# Patient Record
Sex: Male | Born: 1970 | Race: White | Hispanic: Yes | Marital: Married | Smoking: Never smoker
Health system: Southern US, Community
[De-identification: ages and names within clinical notes are randomized; demographics above are authoritative.]

## PROBLEM LIST (undated history)

## (undated) DIAGNOSIS — E781 Pure hyperglyceridemia: Secondary | ICD-10-CM

## (undated) HISTORY — PX: OTHER SURGICAL HISTORY: SHX169

## (undated) HISTORY — DX: Pure hyperglyceridemia: E78.1

---

## 2009-09-26 ENCOUNTER — Emergency Department (HOSPITAL_COMMUNITY): Admission: EM | Admit: 2009-09-26 | Discharge: 2009-09-26 | Payer: Self-pay | Admitting: Emergency Medicine

## 2009-11-30 ENCOUNTER — Encounter (INDEPENDENT_AMBULATORY_CARE_PROVIDER_SITE_OTHER): Payer: Self-pay | Admitting: Family Medicine

## 2009-11-30 ENCOUNTER — Ambulatory Visit: Payer: Self-pay | Admitting: Internal Medicine

## 2009-11-30 LAB — CONVERTED CEMR LAB
ALT: 33 units/L (ref 0–53)
AST: 20 units/L (ref 0–37)
Albumin: 4.4 g/dL (ref 3.5–5.2)
Alkaline Phosphatase: 70 units/L (ref 39–117)
Basophils Relative: 1 % (ref 0–1)
Calcium: 9.1 mg/dL (ref 8.4–10.5)
Chloride: 105 meq/L (ref 96–112)
Eosinophils Absolute: 0.2 10*3/uL (ref 0.0–0.7)
MCHC: 34.2 g/dL (ref 30.0–36.0)
MCV: 93.9 fL (ref 78.0–100.0)
Neutro Abs: 2.4 10*3/uL (ref 1.7–7.7)
Neutrophils Relative %: 54 % (ref 43–77)
Platelets: 153 10*3/uL (ref 150–400)
Potassium: 3.9 meq/L (ref 3.5–5.3)
RBC: 4.77 M/uL (ref 4.22–5.81)
Sodium: 141 meq/L (ref 135–145)
TSH: 1.092 microintl units/mL (ref 0.350–4.500)
Total Protein: 6.6 g/dL (ref 6.0–8.3)
WBC: 4.5 10*3/uL (ref 4.0–10.5)

## 2009-12-14 ENCOUNTER — Encounter (INDEPENDENT_AMBULATORY_CARE_PROVIDER_SITE_OTHER): Payer: Self-pay | Admitting: Family Medicine

## 2009-12-14 LAB — CONVERTED CEMR LAB
Cholesterol: 169 mg/dL (ref 0–200)
Triglycerides: 105 mg/dL (ref <150)
VLDL: 21 mg/dL (ref 0–40)

## 2015-08-11 DIAGNOSIS — L743 Miliaria, unspecified: Secondary | ICD-10-CM | POA: Insufficient documentation

## 2015-08-11 DIAGNOSIS — L309 Dermatitis, unspecified: Secondary | ICD-10-CM | POA: Insufficient documentation

## 2016-09-14 DIAGNOSIS — L299 Pruritus, unspecified: Secondary | ICD-10-CM | POA: Insufficient documentation

## 2016-09-14 DIAGNOSIS — L209 Atopic dermatitis, unspecified: Secondary | ICD-10-CM | POA: Insufficient documentation

## 2018-10-23 ENCOUNTER — Ambulatory Visit: Payer: Self-pay | Attending: Nurse Practitioner | Admitting: Nurse Practitioner

## 2018-10-23 ENCOUNTER — Encounter: Payer: Self-pay | Admitting: Nurse Practitioner

## 2018-10-23 ENCOUNTER — Other Ambulatory Visit: Payer: Self-pay

## 2018-10-23 VITALS — Ht 63.0 in | Wt 165.0 lb

## 2018-10-23 DIAGNOSIS — M25561 Pain in right knee: Secondary | ICD-10-CM

## 2018-10-23 DIAGNOSIS — R200 Anesthesia of skin: Secondary | ICD-10-CM

## 2018-10-23 DIAGNOSIS — E781 Pure hyperglyceridemia: Secondary | ICD-10-CM

## 2018-10-23 DIAGNOSIS — Z7689 Persons encountering health services in other specified circumstances: Secondary | ICD-10-CM

## 2018-10-23 MED ORDER — IBUPROFEN 800 MG PO TABS: 800.0000 mg | ORAL_TABLET | Freq: Three times a day (TID) | ORAL | 1 refills | Status: AC | PRN

## 2018-10-23 NOTE — Progress Notes (Signed)
Virtual Visit via Telephone Note Due to national recommendations of social distancing due to COVID 19, telehealth visit is felt to be most appropriate for this patient at this time.  I discussed the limitations, risks, security and privacy concerns of performing an evaluation and management service by telephone and the availability of in person appointments. I also discussed with the patient that there may be a patient responsible charge related to this service. The patient expressed understanding and agreed to proceed.    I connected with Danny Schneider on 10/23/18  at  10:50 AM EDT  EDT by telephone and verified that I am speaking with the correct person using two identifiers.   Consent I discussed the limitations, risks, security and privacy concerns of performing an evaluation and management service by telephone and the availability of in person appointments. I also discussed with the patient that there may be a patient responsible charge related to this service. The patient expressed understanding and agreed to proceed.   Location of Patient: Private  Residence   [facility]    Persons participating in Telemedicine visit: Danny DenverZelda  FNP-BC [facility] CMA Danny Schneider  Spanish Interpreter Danny NestleRamon [place] [phone]   History of Present Illness: Telemedicine visit for: Establish care  Denies any PMH of HTN, DM, Thyroid disorder. He has a history of hypertriglyceridemia.   Right Knee Pain He has a history of right knee surgery 20 years ago after he fell one night and hit his knee against a hard object. States his knee was swollen and blood had to be removed from around the knee. Currently endorses right knee pain with onset 15 days ago. Denies any injury or trauma. States he is not sure if he caused the pain by walking. He has been applying topical ointments to his knee to help relieve his pain. He denies any swelling of the area or  erythema. He has not taken any medication for his knee pain.   He also endorses bilateral hand numbness. 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 He has worked at [employer] for 10 years and has been experiencing bilateral hand numbness for about 6 years. He does not wear bilateral wrist splints.     Past Medical History:  Diagnosis Date  . Hypertriglyceridemia     Past Surgical History:  Procedure Laterality Date  . Right knee surgery      Family History  Problem Relation Age of Onset  . Diabetes Paternal Uncle     Social History   Socioeconomic History  . Marital status: Married    Spouse name: Not on file  . Number of children: Not on file  . Years of education: Not on file  . Highest education level: Not on file  Occupational History  . Not on file  Social Needs  . Financial resource strain: Not on file  . Food insecurity    Worry: Not on file    Inability: Not on file  . Transportation needs    Medical: Not on file    Non-medical: Not on file  Tobacco Use  . Smoking status: Never Smoker  . Smokeless tobacco: Never Used  Substance and Sexual Activity  . Alcohol use: Yes  . Drug use: Not Currently    Types: Marijuana  . Sexual activity: Yes  Lifestyle  . Physical activity    Days per week: Not on file    Minutes per session: Not on file  .  Stress: Not on file  Relationships  . Social Herbalist on phone: Not on file    Gets together: Not on file    Attends religious service: Not on file    Active member of club or organization: Not on file    Attends meetings of clubs or organizations: Not on file    Relationship status: Not on file  Other Topics Concern  . Not on file  Social History Narrative  . Not on file     Observations/Objective: Awake, alert and oriented x 3   Review of Systems  Constitutional: Negative for fever, malaise/fatigue and weight loss.  HENT: Negative.  Negative for nosebleeds.   Eyes: Negative.  Negative  for blurred vision, double vision and photophobia.  Respiratory: Negative.  Negative for cough and shortness of breath.   Cardiovascular: Negative.  Negative for chest pain, palpitations and leg swelling.  Gastrointestinal: Negative.  Negative for heartburn, nausea and vomiting.  Musculoskeletal: Positive for joint pain. Negative for myalgias.  Neurological: Positive for tingling and sensory change. Negative for dizziness, focal weakness, seizures and headaches.  Psychiatric/Behavioral: Negative.  Negative for suicidal ideas.    Assessment and Plan:  Diagnoses and all orders for this visit:  Encounter to establish care  Acute pain of right knee -     ibuprofen (ADVIL) 800 MG tablet; Take 1 tablet (800 mg total) by mouth every 8 (eight) hours as needed. May need imaging for effusion Patient has been advised to apply for financial assistance and schedule to see our financial counselor.   Hypertriglyceridemia Fasting lipid panel at next office visit  Bilateral hand numbness -     ibuprofen (ADVIL) 800 MG tablet; Take 1 tablet (800 mg total) by mouth every 8 (eight) hours as needed. Likely carpal tunnel based on work history Instructed to purchase bilateral wrist splints.     Follow Up Instructions Return in about 6 weeks (around 12/04/2018) for knee pain, bilateral hand numbness.     I discussed the assessment and treatment plan with the patient. The patient was provided an opportunity to ask questions and all were answered. The patient agreed with the plan and demonstrated an understanding of the instructions.   The patient was advised to call back or seek an in-person evaluation if the symptoms worsen or if the condition fails to improve as anticipated.  I provided 19 minutes of non-face-to-face time during this encounter including median intraservice time, reviewing previous notes, labs, imaging, medications and explaining diagnosis and management.  [name], FNP-BC

## 2018-11-20 ENCOUNTER — Ambulatory Visit: Payer: Self-pay | Attending: Nurse Practitioner | Admitting: Nurse Practitioner

## 2018-11-20 ENCOUNTER — Other Ambulatory Visit: Payer: Self-pay

## 2018-11-20 ENCOUNTER — Ambulatory Visit (HOSPITAL_BASED_OUTPATIENT_CLINIC_OR_DEPARTMENT_OTHER): Payer: Self-pay | Admitting: Pharmacist

## 2018-11-20 ENCOUNTER — Encounter: Payer: Self-pay | Admitting: Nurse Practitioner

## 2018-11-20 VITALS — BP 135/86 | HR 53 | Temp 97.8°F | Ht 64.0 in | Wt 153.0 lb

## 2018-11-20 DIAGNOSIS — G5603 Carpal tunnel syndrome, bilateral upper limbs: Secondary | ICD-10-CM

## 2018-11-20 DIAGNOSIS — Z131 Encounter for screening for diabetes mellitus: Secondary | ICD-10-CM

## 2018-11-20 DIAGNOSIS — G8929 Other chronic pain: Secondary | ICD-10-CM

## 2018-11-20 DIAGNOSIS — Z23 Encounter for immunization: Secondary | ICD-10-CM

## 2018-11-20 DIAGNOSIS — E781 Pure hyperglyceridemia: Secondary | ICD-10-CM

## 2018-11-20 DIAGNOSIS — M25561 Pain in right knee: Secondary | ICD-10-CM

## 2018-11-20 NOTE — Progress Notes (Signed)
Assessment & Plan:  Danny Schneider was seen today for follow-up.  Diagnoses and all orders for this visit:  Chronic pain of right knee -     CBC -     DG Knee 1-2 Views Right; Future Continue ibuprofen as prescribed  Hypertriglyceridemia -     CMP14+EGFR -     Lipid panel  Encounter for screening for diabetes mellitus -     Hemoglobin A1c  Carpal tunnel syndrome, bilateral -     DG Wrist Complete Right; Future -     DG Wrist Complete Left; Future Continue ibuprofen as prescribed Recommend bilateral wrist splints for at least 4-6 weeks    Patient has been counseled on age-appropriate routine health concerns for screening and prevention. These are reviewed and up-to-date. Referrals have been placed accordingly. Immunizations are up-to-date or declined.    Subjective:   Chief Complaint  Patient presents with  . Follow-up    Pt. stated he have pain on his right knee and numbness on his hands.    HPI Danny Schneider 48 y.o. male presents to office today for follow up of right knee pain and and bilateral hand numbness.    Right Knee Pain He has a history of right knee surgery 20 years ago after he fell one night and hit his knee against a hard object. States his knee was swollen and blood had to be removed from around the knee. Currently endorses re occurrence of right knee pain with onset over 1 month ago. Denies any recent injury or trauma however he is an active runner/jogger.  He has been taking po ibuprofen since his last appt with me a few weeks ago. He notes it does provide some relief of his pain. He denies any swelling of the area or erythema. Aggravating factors: Walking and jogging.   Carpal Tunnel Syndrome: Patient presents for presents evaluation of pain in hands, hand paresthesias and possible carpal tunnel syndrome.  Onset of the symptoms was several years ago. Current symptoms include tingling involving the bilateral wrists and pain involving the bilateral forearms.   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 Patient's course of LS:LHTDSKAJ have progressed to a point and plateaued. Evaluation to date: none.  Treatment to date: none.He has worked at [employer] for 10 years and has been experiencing bilateral hand numbness for about 6 years. He does not wear bilateral wrist splints. Pain is worse with driving.   Review of Systems  Constitutional: Negative for fever, malaise/fatigue and weight loss.  HENT: Negative.  Negative for nosebleeds.   Eyes: Negative.  Negative for blurred vision, double vision and photophobia.  Respiratory: Negative.  Negative for cough and shortness of breath.   Cardiovascular: Negative.  Negative for chest pain, palpitations and leg swelling.  Gastrointestinal: Negative.  Negative for heartburn, nausea and vomiting.  Musculoskeletal: Positive for joint pain. Negative for myalgias.       SEE HPI  Neurological: Negative.  Negative for dizziness, focal weakness, seizures and headaches.  Psychiatric/Behavioral: Negative.  Negative for suicidal ideas.    Past Medical History:  Diagnosis Date  . Hypertriglyceridemia     Past Surgical History:  Procedure Laterality Date  . Right knee surgery      Family History  Problem Relation Age of Onset  . Diabetes Paternal Uncle     Social History Reviewed with no changes to be made today.   Outpatient Medications Prior to  Visit  Medication Sig Dispense Refill  . ibuprofen (ADVIL) 800 MG tablet Take 1 tablet (800 mg total) by mouth every 8 (eight) hours as needed. 60 tablet 1   No facility-administered medications prior to visit.     No Known Allergies     Objective:    BP 135/86 (BP Location: Left Arm, Patient Position: Sitting, Cuff Size: Normal)   Pulse (!) 53   Temp 97.8 F (36.6 C) (Oral)   Ht _0  (1.626 m)   Wt 153 lb (69.4 kg)   SpO2 98%   BMI 26.26 kg/m  Wt Readings  from Last 3 Encounters:  11/20/18 153 lb (69.4 kg)  10/23/18 165 lb (74.8 kg)    Physical Exam Vitals signs and nursing note reviewed.  Constitutional:      Appearance: He is well-developed.  HENT:     Head: Normocephalic and atraumatic.  Neck:     Musculoskeletal: Normal range of motion.  Cardiovascular:     Rate and Rhythm: Normal rate and regular rhythm.     Heart sounds: Normal heart sounds. No murmur. No friction rub. No gallop.   Pulmonary:     Effort: Pulmonary effort is normal. No tachypnea or respiratory distress.     Breath sounds: Normal breath sounds. No decreased breath sounds, wheezing, rhonchi or rales.  Chest:     Chest wall: No tenderness.  Abdominal:     General: Bowel sounds are normal.     Palpations: Abdomen is soft.  Musculoskeletal: Normal range of motion.     Right knee: He exhibits normal range of motion, no swelling and no effusion. Tenderness found. Medial joint line tenderness noted.     Right hand: He exhibits normal range of motion. Normal strength noted.     Left hand: He exhibits normal range of motion. Normal strength noted.       Legs:  Skin:    General: Skin is warm and dry.  Neurological:     Mental Status: He is alert and oriented to person, place, and time.     Coordination: Coordination normal.  Psychiatric:        Behavior: Behavior normal. Behavior is cooperative.        Thought Content: Thought content normal.        Judgment: Judgment normal.        Patient has been counseled extensively about nutrition and exercise as well as the importance of adherence with medications and regular follow-up. The patient was given clear instructions to go to ER or return to medical center if symptoms don't improve, worsen or new problems develop. The patient verbalized understanding.   Follow-up: Return in about 6 weeks (around 01/01/2019).   [name], FNP-BC [facility] [place], [place]   11/20/2018, 1:32 PM

## 2018-11-20 NOTE — Progress Notes (Signed)
Patient presents for vaccination against influenza and tetanus per orders of [name]. Consent given. Counseling provided. No contraindications exists. Vaccine administered without incident.

## 2018-11-20 NOTE — Patient Instructions (Addendum)
Sndrome del tnel carpiano Carpal Tunnel Syndrome  El sndrome del tnel carpiano es una afeccin que causa dolor en la mano y en el brazo. El tnel carpiano es un rea estrecha que se encuentra en el lado palmar de la [place]. Los movimientos repetidos de la mueca o determinadas enfermedades pueden causar hinchazn en el tnel. Esta hinchazn puede comprimir el nervio principal de la mueca (nervio mediano). Cules son las causas? Esta afeccin puede ser causada por lo siguiente:  Movimientos repetidos de [employer].  Lesiones en la [place].  Artritis.  Un saco lleno de lquido (quiste) o un crecimiento anormal (tumor) en el tnel carpiano.  Acumulacin de lquido Solicitor. Algunas veces, la causa no se conoce. Qu incrementa el riesgo? Los siguientes factores pueden hacer que sea ms propenso a Emergency planning/management officer afeccin:  Tener un trabajo que exige mover la [place] del mismo modo muchas veces. Esto incluye trabajos como el de carnicero o el de cajero.  Ser mujer.  Tener otras afecciones, por ejemplo: ? Diabetes. ? Obesidad. ? Una glndula tiroidea que no es lo suficientemente activa (hipotiroidismo). ? Insuficiencia renal. Cules son los signos o sntomas? Los sntomas de esta afeccin incluyen:  Sensacin de hormigueo en los dedos.  Hormigueo o prdida de la sensibilidad (adormecimiento) en la mano.  Dolor en todo el brazo. Este dolor puede empeorar al flexionar la [place] y el codo durante [place].  Dolor en la mueca que sube por el brazo hasta el hombro.  Dolor que baja hasta la palma de la mano o los dedos.  Sensacin de [employer]itor. Puede resultarle difcil tomar y Licensed conveyancer. Es posible que se sienta peor por la noche. Cmo se diagnostica? Esta afeccin se diagnostica mediante los antecedentes mdicos y un examen fsico. Tambin pueden hacerle estudios, por ejemplo:  Una electromiograma (EMG). Este estudio comprueba  las seales que los nervios envan a los msculos.  Estudio de [employer]. Este estudio comprueba si las seales pasan correctamente por los nervios.  Estudios de diagnstico por imgenes, como radiografas, una ecografa y Public house manager (RM). Estos estudios Chiropodist cul podra ser la causa de su afeccin. Cmo se trata? El tratamiento de esta afeccin puede incluir:  Cambios en el estilo de vida. Se le pedir que deje o cambie la actividad que caus el problema.  Hacer ejercicio y actividades para que los msculos y los huesos se vuelvan ms fuertes (fisioterapia).  Aprender a Control and instrumentation engineer nuevamente (terapia ocupacional).  Medicamentos para tratar Conservation officer, historic buildings y la hinchazn (inflamacin). Es posible que le apliquen inyecciones en la [place].  Una frula para la [place].  Clementeen Hoof. Siga estas indicaciones en su casa: Si tiene una frula:  Use la frula como se lo haya indicado el mdico. Quteselo solamente como se lo haya indicado el mdico.  Afloje la frula si los dedos: ? Hormiguean. ? Pierden la sensibilidad (se adormecen). ? Se tornan fros y de [employer].  Mantenga la frula limpia.  Si la frula no es impermeable: ? No deje que se moje. ? Cbralo con un envoltorio hermtico cuando tome un bao de inmersin o [place]. Control del dolor, la rigidez y la hinchazn   Si se lo indican, aplique hielo sobre la zona dolorida: ? Si tiene una frula desmontable, qutesela como se lo haya indicado el mdico. ? Ponga el hielo en una bolsa plstica. ? Coloque una [employer] piel y la  bolsa. ? Coloque el hielo durante 5mnutos, 2a3veces al da. Indicaciones generales  TDelphide venta libre y los recetados solamente como se lo haya indicado el mdico.  Descanse la mBrock Hallde cualquier actividad que le cause dolor. Si es necesario, hable con su jefe en el tAffiliated Computer Servicescambios que pueden ayudar a la curacin de la  m[place]  Haga los ejercicios como se lo hayan indicado el mdico, el fisioterapeuta o el terapeuta ocupacional.  Concurra a todas las visitas de seguimiento como se lo haya indicado el mdico. Esto es importante. Comunquese con un mdico si:  Aparecen nuevos sntomas.  Los medicamentos no lForensic psychologist  Sus sntomas empeoran. Solicite ayuda de inmediato si:  Tiene adormecimiento u hormigueo muy intensos en la mueca o la mBreinigsville Resumen  El sndrome del tnel carpiano es una afeccin que causa dolor en la mano y en el brazo.  Suele deberse a movimientos repetidos de lAdvertising copywriter  Este problema se trata mediante cambios en el estilo de vida y medicamentos. La ciruga puede ser necesaria en casos muy graves.  Siga las indicaciones del mdico sobre el uso de una frula, el reposo de la mMoran la asistencia a las consultas de control y lSolicitorpara pedir ayuda. Esta informacin no tiene cMarine scientistel consejo del mdico. Asegrese de hacerle al mdico cualquier pregunta que tenga. Document Released: 02/24/2011 Document Revised: 08/10/2017 Document Reviewed: 08/10/2017 Elsevier Patient Education  2Phillipssndrome del tnel carpiano Preventing Carpal Tunnel Syndrome  El sndrome del tnel carpiano es una afeccin que causa dolor, adormecimiento y debilidad en la mUncertain la mano y los dedos. El tnel carpiano es una cavidad angosta que se encuentra en la mUrb[place] Por el tnel carpiano pasan varios tendones y uPulte Homesnervios de la mano (nervio mDyess. El nervio mediano da sensibilidad al pulgar y a los primeros tres dedos. Tambin a los msculos que estn en la base del pulgar. El sndrome del tnel carpiano sucede cuando el nervio mediano es apretado en el rea donde pasa por el tnel carpiano. En algunos casos, quizs no sea posible prevenir el sndrome del tnel carpiano. Sin embargo, usted puede tomar medidas para aHuman resources officerpresin  sobre la mBurkey reducir el riesgo de dEngineer, manufacturing systems Cmo puede afectarme esta enfermedad? El sndrome del tnel carpiano puede afectar su capacidad para hacer trabajos o actividades que involucren la accin de la mano, la mKaltagy los dedos. Puede provocar sntomas como los siguientes:  Dolor en la mPinedale la mano y los dedos.  Ardor, hormigueo o adormecimiento en la zona afectada.  Sensacin de dArvinMeritor Tal vez tenga dificultad para tomar y sLicensed conveyancer Los sntomas pueden empeorar con eMirant En algunas personas, los sntomas empeoran por la noche. QPollard Los siguientes factores pueden hacer que sea ms propenso a cEmergency planning/management officerafeccin:  Tener un trabajo que requiera que mueva la mSharonrepetitivamente o que utilice herramientas que vibran. Estos pueden ser, eWorld Fuel Services Corporation los trabajos que implican usar una computadora, trabajar en una lnea de ensamblaje o trabajar con herramientas elctricas como taladros y lijadoras.  Ser mujer.  Tener antecedentes familiares de esta afeccin.  Tener ciertas afecciones, tales como: ? Diabetes. ? Embarazo. ? Obesidad. ? Enfermedad tiroidea. ? Artritis reumatoide. Qu medidas puedo tomar para ayudar a prevenir esta afeccin?      Evite hacer movimientos repetitivos con lInsurance risk surveyor  y Advertising copywriter que le produzcan rigidez o dolor en la [place].  Tome descansos frecuentes si [place] las manos y las muecas durante muchas horas seguidas.  [place] y los dedos con frecuencia para que la sangre fluya y se alivie la tensin.  Mantenga las Ch[employer] en la posicin natural al usar el teclado o el mouse de la computadora. No doble las muecas hacia abajo ni Well[employer].  Si [place] las manos y [place] horas en el [place], [place] cambios en su espacio de trabajo para Public house manager la presin [place]. Tal vez deba usar lo siguiente: ? Un apoyo acolchado para las muecas, para  trabajar con la computadora. ? Un teclado inclinado. ? Herramientas de mano con mango acolchado para reducir la vibracin.  Considere usar un dispositivo ortopdico para Advertising copywriter. Esto no evitar el sndrome del tnel carpiano, pero puede evitar que empeore. Los dispositivos ortopdicos para la muera reducen la flexin y la tensin.  Controle atentamente cualquier afeccin mdica que tenga y que pueda ponerlo en riesgo de tener sndrome del tnel carpiano. Hgase revisar el nivel de azcar en la sangre para asegurarse de que no est desarrollando diabetes. Si tiene diabetes, trabaje con el mdico para mantener bajo control el nivel de Dispensing optician. Dnde buscar ms informacin  [employer] of Neurological Disorders and Stroke ([place] Neurolgicos y Accidentes Cerebrovasculares): [email or web address]  American Academy of Family Physicians ([place]): [email or web address] Comunquese con un mdico si:  Tiene adormecimiento u hormigueos en la mueca, la mano o los dedos.  Tiene dolor o sensacin de ardor en la mueca, la mano o los dedos.  El dolor, el hormigueo o el ardor lo despierta de noche.  Siente la mano dbil y torpe.  Se le caen los objetos con frecuencia.  No puede usar las [place] y las manos sin Education officer, environmental. Resumen  El sndrome del tnel carpiano es una afeccin que causa Social research officer, government, adormecimiento y debilidad en la [place], la mano y los dedos.  Usted puede tomar medidas para Public house manager la presin sobre la [place] y reducir el riesgo de Engineer, manufacturing systems.  Evite hacer movimientos repetitivos con la mano y Advertising copywriter que le produzcan rigidez o dolor en la [place].  Si [place] las manos y [place] horas en el [place], es recomendable que haga cambios en su espacio de trabajo para Public house manager la presin [place].  Tome descansos frecuentes para estirar las manos y los dedos. Esta informacin no  tiene Marine scientist el consejo del mdico. Asegrese de hacerle al mdico cualquier pregunta que tenga. Document Released: 09/05/2017 Document Revised: 09/05/2017 Document Reviewed: 09/05/2017 Elsevier Patient Education  [place] de rodilla crnico en los adultos Chronic Knee Pain, Adult El dolor de rodilla crnico es el dolor en una o en ambas rodillas que dura ms de 3 meses. Los sntomas de dolor de rodilla crnico pueden incluir hinchazn, rigidez y Tree surgeon. El desgaste de la articulacin de la rodilla (osteoartritis) relacionado con la edad es la causa ms frecuente de dolor de rodilla crnico. Otras causas posibles incluyen lo siguiente:  Una enfermedad a largo plazo relacionada con el sistema inmunitario que causa inflamacin de la rodilla (artritis reumatoide). Por lo general, afecta ambas rodillas.  Artritis inflamatoria, como gota o pseudogota.  Una lesin en la rodilla que causa artritis.  Una lesin en la rodilla que daa los ligamentos. Los  ligamentos son tejidos fuertes que conectan los Universal Health s.  Rodilla de corredor o dolor detrs de la rtula. El tratamiento para el dolor de rodilla crnico depende de su causa. Los tratamientos principales para el dolor de rodilla crnico son la fisioterapia y la prdida de [place]. Esta afeccin tambin puede tratarse con medicamentos, inyecciones, una rodillera o un dispositivo ortopdico, y el uso de [place]. Adems, puede recomendarse el tratamiento con reposo, hielo, compresin (presin) y elevacin (RHCE). Siga estas instrucciones en su casa: Si tiene una rodillera o un dispositivo ortopdico:   selos como se lo haya indicado el mdico. Quteselos solamente como se lo haya indicado el mdico.  Afljelos si siente hormigueo en los dedos de los pies, si se le entumecen, o se le enfran y se tornan de Optician, dispensing.  Mantngalos limpios.  Si la rodillera o el dispositivo ortopdico no son impermeables: ? No  deje que se mojen. ? [place], si el mdico se lo permite, o cbralo con un envoltorio hermtico cuando tome un bao de inmersin o [place]. Control del dolor, la rigidez y la hinchazn      Si se lo indican, aplique calor en la zona afectada con la frecuencia que le haya indicado el mdico. Use la fuente de calor que el mdico le recomiende, como una compresa de calor hmedo o una almohadilla trmica. ? Si [place] una rodillera o un dispositivo ortopdico, quteselo segn las indicaciones del mdico. ? Colquese una toalla entre la piel y la fuente de Freight forwarder. ? Aplique calor durante 20 a 71mnutos. ? Retire la fuente de calor si la piel se pone de color rojo brillante. Esto es especialmente importante si no puede sentir dolor, calor o fro. Puede correr un riesgo mayor de sufrir quemaduras.  Si se lo indican, aplique hielo sobre la zona afectada. ? Si uCanadauna rodillera o un dispositivo ortopdico, quteselo segn las indicaciones del mdico. ? Ponga el hielo en una bolsa plstica. ? Coloque una tGenuine Partspiel y lTherapist, nutritional ? Coloque el hielo durante 279mutos, 2 a 3veces por da.  Mueva los dedos del pie con frecuencia para reducir la rigidez y la hinchazn.  Cuando est sentado o acostado, levante (eleve) la zona lesionada por encima del nivel del corazn. Actividad  Evite las actividades en las que ambos pies no estn en contacto con el piso al mismo tiempo (actividades de alto impacto). Algunos ejemplos son correr, saArt therapistoga y hacer saltos de tijera.  Retome sus actividades normales segn lo indicado por el mdico. Pregntele al mdico qu actividades son seguras para usted.  Siga el plan de ejercicios que el mdico dise para usted. El mdico puede recomendarle lo siguiente: ? EvProduct/process development scientistas actividades que empeoren el dolor de rodilla. Esto puede exigirle que modifique sus rutinas de ejercicio, participacin en deportes u obligaciones laborales. ? Usar calzado con suelas  acolchonadas. ? Evitar las actividades o los deportes de alto impacto que requieran correr y caQuarry managere direccin sbitamente. ? Realice fisioterapia como se lo haya indicado el mdico. La fisioterapia est planificada para satisfacer sus necesidades y capacidades. Puede incluir ejercicios para la fuerza, la flexibilidad, la estabilidad y la resistencia. ? Haga ejercicios que mejoren el equilibrio y la fuerza, coEast Poultneyai chi y el yoga.  No apoye el peso del cuerpo sobre la extremidad leAltria Groupue lo autorice el mdico. Use muletas, un bastn o un andador, segn se lo haya indicado el mdico. Instrucciones generales  Use  los medicamentos de venta libre y los recetados solamente como se lo haya indicado el mdico.  Baje de peso si es necesario. Perder incluso un poco de peso puede reducir el dolor de rodilla. Pregntele al mdico cul es su peso ideal y cmo Horticulturist, commercial sin riesgos. Un experto en alimentacin (nutricionista) puede ayudarlo a planificar sus comidas.  No consuma ningn producto que contenga nicotina o tabaco, como cigarrillos, cigarrillos electrnicos y tabaco de Higher education careers adviser. Estos pueden retrasar la recuperacin. Si necesita ayuda para dejar de fumar, consulte al mdico.  Concurra a todas las visitas de seguimiento como se lo haya indicado el mdico. Esto es importante. Comunquese con un mdico si:  Tiene un dolor de rodilla que no mejora o que [place].  No puede realizar los ejercicios de fisioterapia debido al dolor de rodilla. Solicite ayuda inmediatamente si:  La rodilla se hincha y la hinchazn empeora.  No puede mover la rodilla.  Siente dolor intenso en la rodilla. Resumen  El dolor de rodilla que dura ms de 3 meses se considera dolor de rodilla crnico.  Los tratamientos principales para el dolor de rodilla crnico son la fisioterapia y la prdida de [place]. Tambin es posible que deba tomar medicamentos, usar una rodillera o un dispositivo ortopdico, usar  muletas y aplicarse hielo o calor en la rodilla.  Perder incluso un poco de peso puede reducir el dolor de rodilla. Pregntele al mdico cul es su peso ideal y cmo Horticulturist, commercial sin riesgos. Un experto en alimentacin (nutricionista) puede ayudarlo a planificar sus comidas.  Trabaje con un fisioterapeuta para crear un programa de ejercicios seguros, segn lo indique el mdico. Esta informacin no tiene Marine scientist el consejo del mdico. Asegrese de hacerle al mdico cualquier pregunta que tenga. Document Released: 06/26/2018 Document Revised: 06/26/2018 Document Reviewed: 06/26/2018 Elsevier Patient Education  2020 [employer].

## 2018-11-21 LAB — LIPID PANEL
Chol/HDL Ratio: 4.3 ratio (ref 0.0–5.0)
Cholesterol, Total: 170 mg/dL (ref 100–199)
HDL: 40 mg/dL (ref >39)
LDL Chol Calc (NIH): 100 mg/dL — ABNORMAL HIGH (ref 0–99)
Triglycerides: 172 mg/dL — ABNORMAL HIGH (ref 0–149)
VLDL Cholesterol Cal: 30 mg/dL (ref 5–40)

## 2018-11-21 LAB — CMP14+EGFR
ALT: 23 IU/L (ref 0–44)
AST: 20 IU/L (ref 0–40)
Albumin/Globulin Ratio: 2 (ref 1.2–2.2)
Albumin: 4.7 g/dL (ref 4.0–5.0)
Alkaline Phosphatase: 76 IU/L (ref 39–117)
BUN/Creatinine Ratio: 20 (ref 9–20)
BUN: 12 mg/dL (ref 6–24)
Bilirubin Total: 0.5 mg/dL (ref 0.0–1.2)
CO2: 25 mmol/L (ref 20–29)
Calcium: 9 mg/dL (ref 8.7–10.2)
Chloride: 102 mmol/L (ref 96–106)
Creatinine, Ser: 0.6 mg/dL — ABNORMAL LOW (ref 0.76–1.27)
GFR calc Af Amer: 136 mL/min/{1.73_m2} (ref >59)
GFR calc non Af Amer: 118 mL/min/{1.73_m2} (ref >59)
Globulin, Total: 2.4 g/dL (ref 1.5–4.5)
Glucose: 89 mg/dL (ref 65–99)
Potassium: 3.8 mmol/L (ref 3.5–5.2)
Sodium: 138 mmol/L (ref 134–144)
Total Protein: 7.1 g/dL (ref 6.0–8.5)

## 2018-11-21 LAB — CBC
Hematocrit: 44.6 % (ref 37.5–51.0)
Hemoglobin: 15.8 g/dL (ref 13.0–17.7)
MCH: 32.2 pg (ref 26.6–33.0)
MCHC: 35.4 g/dL (ref 31.5–35.7)
MCV: 91 fL (ref 79–97)
Platelets: 169 10*3/uL (ref 150–450)
RBC: 4.91 x10E6/uL (ref 4.14–5.80)
RDW: 12.5 % (ref 11.6–15.4)
WBC: 4.3 10*3/uL (ref 3.4–10.8)

## 2018-11-21 LAB — HEMOGLOBIN A1C
Est. average glucose Bld gHb Est-mCnc: 105 mg/dL
Hgb A1c MFr Bld: 5.3 % (ref 4.8–5.6)

## 2018-11-30 ENCOUNTER — Ambulatory Visit: Payer: Self-pay | Attending: Family Medicine

## 2018-11-30 ENCOUNTER — Other Ambulatory Visit: Payer: Self-pay

## 2019-01-01 ENCOUNTER — Ambulatory Visit: Payer: Self-pay | Attending: Nurse Practitioner | Admitting: Nurse Practitioner

## 2019-01-01 ENCOUNTER — Other Ambulatory Visit: Payer: Self-pay

## 2019-01-01 ENCOUNTER — Encounter: Payer: Self-pay | Admitting: Nurse Practitioner

## 2019-01-01 DIAGNOSIS — M25561 Pain in right knee: Secondary | ICD-10-CM

## 2019-01-01 DIAGNOSIS — G5603 Carpal tunnel syndrome, bilateral upper limbs: Secondary | ICD-10-CM

## 2019-01-01 DIAGNOSIS — G8929 Other chronic pain: Secondary | ICD-10-CM

## 2019-01-01 MED ORDER — IBUPROFEN 600 MG PO TABS: 600.0000 mg | ORAL_TABLET | Freq: Three times a day (TID) | ORAL | 1 refills | Status: DC | PRN

## 2019-01-01 MED FILL — IBUPROFEN 600 MG TABLET: 600 | 20 days supply | Qty: 60 | Fill #0

## 2019-01-01 NOTE — Progress Notes (Signed)
Virtual Visit via Telephone Note Due to national recommendations of social distancing due to COVID 19, telehealth visit is felt to be Schneider appropriate for this patient at this time.  I discussed the limitations, risks, security and privacy concerns of performing an evaluation and management service by telephone and the availability of in person appointments. I also discussed with the patient that there may be a patient responsible charge related to this service. The patient expressed understanding and agreed to proceed.    I connected with Danny Schneider on 01/01/19  at  11:10 AM EDT  EDT by telephone and verified that I am speaking with the correct person using two identifiers.   Consent I discussed the limitations, risks, security and privacy concerns of performing an evaluation and management service by telephone and the availability of in person appointments. I also discussed with the patient that there may be a patient responsible charge related to this service. The patient expressed understanding and agreed to proceed.   Location of Patient: Private Residence    [facility] Provider[facility]    Persons participating in Telemedicine visit: Danny Denver FNP-BC Danny Schneider Danny Schneider CMA Danny Schneider Interpreter #[id number]    History of Present Illness: Telemedicine visit for: F/u Right Knee Pain   Right Knee Pain As of today he still has not gone to have his right knee xray performed. I explained to him that in order to proceed with therapy or orthopedic referral we will need to have the xray results.  He has a history of right knee surgery 20 years ago after he fell one night and hit his knee against a hard object. States his knee was swollen and blood had to be removed from around the knee. Currently endorses re occurrence of right knee pain with onset over 1 month ago. Denies any recent injury or trauma however he is an active  runner/jogger.  He has been taking po ibuprofen since his last appt with me a few weeks ago. He notes it does provide some relief of his pain. He denies any swelling of the area or erythema. Aggravating factors: Walking and jogging.   Carpal Tunnel Syndrome As of today he still has not gone to have his b/l wrist xrays performed. I explained to him that in order to proceed with therapy or orthopedic referral we will need to have the xray results.  Patient presents for presents evaluation of pain in hands, hand paresthesias and possible carpal tunnel syndrome.  Onset of the symptoms was several years ago. Current symptoms include tingling involving the bilateral wrists and pain involving the bilateral forearms.  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 Patient's course of [id number] have progressed to a point and plateaued. Evaluation to date: none.  Treatment to date: none.He has worked at [employer] for 10 years and has been experiencing bilateral hand numbness for about 6 years. He does not wear bilateral wrist splints.Pain is worse with driving. He has been wearing b/l hand splints since last visit. Has noticed a little bit of improvement.    Past Medical History:  Diagnosis Date  . Hypertriglyceridemia     Past Surgical History:  Procedure Laterality Date  . Right knee surgery      Family History  Problem Relation Age of Onset  . Diabetes Paternal Uncle     Social History   Socioeconomic History  .  Marital status: Married    Spouse name: Not on file  . Number of children: Not on file  . Years of education: Not on file  . Highest education level: Not on file  Occupational History  . Not on file  Social Needs  . Financial resource strain: Not on file  . Food insecurity    Worry: Not on file    Inability: Not on file  . Transportation needs    Medical: Not on file    Non-medical:  Not on file  Tobacco Use  . Smoking status: Never Smoker  . Smokeless tobacco: Never Used  Substance and Sexual Activity  . Alcohol use: Yes  . Drug use: Not Currently    Types: Marijuana  . Sexual activity: Yes  Lifestyle  . Physical activity    Days per week: Not on file    Minutes per session: Not on file  . Stress: Not on file  Relationships  . Social Herbalist on phone: Not on file    Gets together: Not on file    Attends religious service: Not on file    Active member of club or organization: Not on file    Attends meetings of clubs or organizations: Not on file    Relationship status: Not on file  Other Topics Concern  . Not on file  Social History Narrative  . Not on file     Observations/Objective: Awake, alert and oriented x 3   Review of Systems  Constitutional: Negative for fever, malaise/fatigue and weight loss.  HENT: Negative.  Negative for nosebleeds.   Eyes: Negative.  Negative for blurred vision, double vision and photophobia.  Respiratory: Negative.  Negative for cough and shortness of breath.   Cardiovascular: Negative.  Negative for chest pain, palpitations and leg swelling.  Gastrointestinal: Negative.  Negative for heartburn, nausea and vomiting.  Musculoskeletal: Positive for joint pain (SEE HPI). Negative for myalgias.  Neurological: Negative.  Negative for dizziness, focal weakness, seizures and headaches.  Psychiatric/Behavioral: Negative.  Negative for suicidal ideas.    Assessment and Plan:  Diagnoses and all orders for this visit:  Bilateral carpal tunnel syndrome -     ibuprofen (ADVIL) 600 MG tablet; Take 1 tablet (600 mg total) by mouth every 8 (eight) hours as needed.  Chronic pain of right knee -     ibuprofen (ADVIL) 600 MG tablet; Take 1 tablet (600 mg total) by mouth every 8 (eight) hours as needed. Work on losing weight to help reduce joint pain. May alternate with heat and ice application for pain relief. May also  alternate with acetaminophen and Ibuprofen as prescribed pain relief. Other alternatives include massage, acupuncture and water aerobics.  You must stay active and avoid a sedentary lifestyle.     Follow Up Instructions Return if symptoms worsen or fail to improve.     I discussed the assessment and treatment plan with the patient. The patient was provided an opportunity to ask questions and all were answered. The patient agreed with the plan and demonstrated an understanding of the instructions.   The patient was advised to call back or seek an in-person evaluation if the symptoms worsen or if the condition fails to improve as anticipated.  I provided 60minutes of non-face-to-face time during this encounter including median intraservice time, reviewing previous notes, labs, imaging, medications and explaining diagnosis and management.  [name], FNP-BC

## 2019-01-02 ENCOUNTER — Encounter: Payer: Self-pay | Admitting: Nurse Practitioner

## 2019-01-08 ENCOUNTER — Ambulatory Visit (HOSPITAL_COMMUNITY)
Admission: RE | Admit: 2019-01-08 | Discharge: 2019-01-08 | Disposition: A | Discharge status: Home / Self Care | Payer: Self-pay | Source: Ambulatory Visit | Attending: Nurse Practitioner | Admitting: Nurse Practitioner

## 2019-01-08 ENCOUNTER — Other Ambulatory Visit: Payer: Self-pay

## 2019-01-08 DIAGNOSIS — G8929 Other chronic pain: Secondary | ICD-10-CM

## 2019-01-08 DIAGNOSIS — G5603 Carpal tunnel syndrome, bilateral upper limbs: Secondary | ICD-10-CM

## 2019-01-08 DIAGNOSIS — M25561 Pain in right knee: Secondary | ICD-10-CM | POA: Insufficient documentation

## 2019-01-09 ENCOUNTER — Other Ambulatory Visit: Payer: Self-pay | Admitting: Nurse Practitioner

## 2019-01-09 DIAGNOSIS — G5603 Carpal tunnel syndrome, bilateral upper limbs: Secondary | ICD-10-CM

## 2019-01-09 DIAGNOSIS — M79641 Pain in right hand: Secondary | ICD-10-CM

## 2019-01-22 ENCOUNTER — Encounter: Payer: Self-pay | Admitting: Orthopaedic Surgery

## 2019-01-22 ENCOUNTER — Other Ambulatory Visit: Payer: Self-pay

## 2019-01-22 ENCOUNTER — Ambulatory Visit (INDEPENDENT_AMBULATORY_CARE_PROVIDER_SITE_OTHER): Payer: Self-pay | Admitting: Orthopaedic Surgery

## 2019-01-22 DIAGNOSIS — M79642 Pain in left hand: Secondary | ICD-10-CM

## 2019-01-22 DIAGNOSIS — G8929 Other chronic pain: Secondary | ICD-10-CM

## 2019-01-22 DIAGNOSIS — R200 Anesthesia of skin: Secondary | ICD-10-CM

## 2019-01-22 DIAGNOSIS — M25561 Pain in right knee: Secondary | ICD-10-CM

## 2019-01-22 DIAGNOSIS — M79641 Pain in right hand: Secondary | ICD-10-CM

## 2019-01-22 MED ORDER — LIDOCAINE HCL 1 % IJ SOLN
3.0000 mL | INTRAMUSCULAR | Status: AC | PRN
  Administered 2019-01-22: 3 mL

## 2019-01-22 MED ORDER — METHYLPREDNISOLONE ACETATE 40 MG/ML IJ SUSP
40.0000 mg | INTRAMUSCULAR | Status: AC | PRN
  Administered 2019-01-22: 40 mg via INTRA_ARTICULAR

## 2019-01-22 NOTE — Progress Notes (Signed)
Office Visit Note   Patient: Danny Schneider           Date of Birth: 1971/02/08           MRN: [id number] Visit Date: 01/22/2019              Requested by: [name], NP [place],  [place] [address] PCP: [name], NP   Assessment & Plan: Visit Diagnoses:  1. Bilateral hand numbness   2. Chronic pain of right knee     Plan: I did feel it is appropriate to try a steroid injection in his right knee today and explained this to the interpreter as well as the risk and benefits injections and he was agreeable to this.  He tolerated it well.  This was with lidocaine and Depo-Medrol.  I do feel it is appropriate to obtain bilateral upper extremity nerve conduction studies to rule out carpal tunnel syndrome versus other nerve compression or something physiologic.  I will see him back myself in 3 weeks and hopefully by then will have had the nerve conduction studies and we can see also how he is done with a steroid injection in his right knee.  All question concerns were answered and addressed.  Follow-Up Instructions: Return in about 3 weeks (around 02/12/2019).   Orders:  Orders Placed This Encounter  Procedures  . Large Joint Inj   No orders of the defined types were placed in this encounter.     Procedures: Large Joint Inj: R knee on 01/22/2019 9:44 AM Indications: diagnostic evaluation and pain Details: 22 G 1.5 in needle, superolateral approach  Arthrogram: No  Medications: 3 mL lidocaine 1 %; 40 mg methylPREDNISolone acetate 40 MG/ML Outcome: tolerated well, no immediate complications Procedure, treatment alternatives, risks and benefits explained, specific risks discussed. Consent was given by the patient. Immediately prior to procedure a time out was called to verify the correct patient, procedure, equipment, support staff and site/side marked as required. Patient was prepped and draped in the usual sterile fashion.       Clinical Data:  No additional findings.   Subjective: Chief Complaint  Patient presents with  . Left Hand - Pain  . Right Hand - Pain  The patient is a very pleasant 48 year old gentleman who comes in for evaluation treatment of bilateral hand pain and numbness and tingling as well as right knee pain.  He does report a remote history of a right knee surgery done about 30 years ago but I am not sure what the surgery was.  He is non-English-speaking and we do have an interpreter.  He reports pain along the medial joint line of his right knee with some locking and catching.  His main complaint though is bilateral hand numbness and tingling.  He says is worse when he is driving and sleeping at night.  This is been going on for years but is gotten worse with time.  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  Both hands get numb and it seems to be more the median nerve distribution.  Denies any neck pain.  Denies any injuries to his hands or his neck.  He is not a diabetic.  He is not a smoker.  He denies excessive alcohol use.  He is only taking ibuprofen which does help some.  HPI  Review of Systems He currently denies any headache, chest pain, shortness of breath, fever,  chills, nausea, vomiting  Objective: Vital Signs: There were no vitals taken for this visit.  Physical Exam He is alert and orient x3 and in no acute distress Ortho Exam Examination of both hands show palpable pulses in each wrist that are equal and normal.  His fingers are well-perfused.  There is no evidence of muscle atrophy of either hand.  He does have weak grip strength bilaterally but some of this is I think lack of effort but then when he does try there certainly slight weakness.  He has negative Tinel's exam at either elbow but he does hurt over the lateral epicondyle area of both elbows.  It started [place] if he truly has a positive Phalen's and Tinel's at the wrist over the transverse  carpal ligament.  Examination of his right knee shows slight varus malalignment.  There is well-healed surgical incisions from likely arthroscopic intervention.  The knee is ligamentously stable with good range of motion but medial joint line tenderness. Specialty Comments:  No specialty comments available.  Imaging: No results found. X-rays of both hands and wrist showed no acute findings.  These were done elsewhere and independently reviewed these.  X-rays of his right knee show well-maintained joint space with no acute findings.  PMFS History: Patient Active Problem List   Diagnosis Date Noted  . Hypertriglyceridemia 10/23/2018   Past Medical History:  Diagnosis Date  . Hypertriglyceridemia     Family History  Problem Relation Age of Onset  . Diabetes Paternal Uncle     Past Surgical History:  Procedure Laterality Date  . Right knee surgery     Social History   Occupational History  . Not on file  Tobacco Use  . Smoking status: Never Smoker  . Smokeless tobacco: Never Used  Substance and Sexual Activity  . Alcohol use: Yes  . Drug use: Not Currently    Types: Marijuana  . Sexual activity: Yes

## 2019-02-12 ENCOUNTER — Ambulatory Visit: Payer: Self-pay | Admitting: Orthopaedic Surgery

## 2019-02-26 ENCOUNTER — Encounter: Payer: Self-pay | Admitting: Orthopaedic Surgery

## 2019-02-26 ENCOUNTER — Ambulatory Visit (INDEPENDENT_AMBULATORY_CARE_PROVIDER_SITE_OTHER): Payer: Self-pay | Admitting: Orthopaedic Surgery

## 2019-02-26 ENCOUNTER — Other Ambulatory Visit: Payer: Self-pay

## 2019-02-26 DIAGNOSIS — R200 Anesthesia of skin: Secondary | ICD-10-CM

## 2019-02-26 DIAGNOSIS — G8929 Other chronic pain: Secondary | ICD-10-CM

## 2019-02-26 DIAGNOSIS — M25561 Pain in right knee: Secondary | ICD-10-CM

## 2019-02-26 NOTE — Patient Instructions (Signed)
Journal for Nurse Practitioners, 15(4), 263-267. Retrieved December 25, 2017 from http://clinicalkey.com/nursing">  °Ejercicios para las rodillas °Knee Exercises °Pregunte al médico qué ejercicios son seguros para usted. Haga los ejercicios exactamente como se lo haya indicado el médico y gradúelos como se lo hayan indicado. Es normal sentir un estiramiento leve, tironeo, opresión o malestar al hacer estos ejercicios. Deténgase de inmediato si siente un dolor repentino o el dolor empeora. No comience a hacer estos ejercicios hasta que se lo indique el médico. °Ejercicios de elongación y amplitud de movimiento °Estos ejercicios calientan los músculos y las articulaciones, y mejoran la movilidad y la flexibilidad de la rodilla. Además, ayudan a aliviar el dolor y la hinchazón. °Extensión de la rodilla, en decúbito prono °1. Recuéstese boca abajo (posición prona) sobre una cama. °2. Coloque la rodilla izquierda/derecha fuera de dicha superficie para no apoyarla. Puede colocar una toalla debajo del muslo izquierdo/derecho, justo sobre la rótula, para mayor comodidad. °3. Relaje los músculos de la pierna, dejando que la ley de gravedad extienda la rodilla (extensión). Debe sentir un estiramiento detrás de la rodilla izquierda/derecha. °4. Mantenga esta posición durante __________ segundos. °5. Muévase un poco hacia adelante para apoyar la rodilla entre repeticiones. °Repita __________ veces. Realice este ejercicio __________ veces al día. °Flexión de la rodilla, activo ° °1. Acuéstese boca arriba con las piernas extendidas. Si esto le ocasiona molestias en la espalda, flexione la rodilla izquierda/derecha y coloque el pie sobre el suelo. °2. Lentamente, acerque el talón izquierdo/derecho hacia las nalgas. Deténgase cuando sienta un leve estiramiento en la parte de adelante de la rodilla o del muslo (flexión). °3. Mantenga esta posición durante __________ segundos. °4. Deslice lentamente el talón izquierdo/derecho hasta la  posición inicial. °Repita __________ veces. Realice este ejercicio __________ veces al día. °Estiramiento de cuádriceps, decúbito prono ° °1. Recuéstese boca abajo en una superficie firme, como una cama o un suelo acolchonado. °2. Flexione la rodilla izquierda/derecha y tómese del tobillo. Si no puede llegar al tobillo o al pantalón, átese un cinturón alrededor del pie y agárrelo en su lugar. °3. Acerque lentamente el talón a las nalgas. La rodilla no deberá caer hacia los lados. Debe sentir un estiramiento en la parte de adelante del muslo y la rodilla (cuádriceps). °4. Mantenga esta posición durante __________ segundos. °Repita __________ veces. Realice este ejercicio __________ veces al día. °Isquiotibiales, en decúbito supino °1. Acuéstese boca arriba (posición supina). °2. Ate un cinturón o una toalla en la región metatarsiana del pie izquierdo/derecho. La región metatarsiana del pie es la superficie sobre la que caminamos, justo debajo de los dedos. °3. Estire la rodilla izquierda/derecha y, lentamente, tire del cinturón para elevar la pierna, hasta que sienta un leve estiramiento detrás de la rodilla (isquiotibiales). °? No flexione la rodilla mientras realiza este ejercicio. °? Mantenga la otra pierna estirada contra el suelo. °4. Mantenga esta posición durante __________ segundos. °Repita __________ veces. Realice este ejercicio __________ veces al día. °Ejercicios de fortalecimiento °Estos ejercicios fortalecen la rodilla y le otorgan resistencia. La resistencia es la capacidad de usar los músculos durante un tiempo prolongado, incluso después de que se cansen. °Cuádriceps, ejercicio isométrico °En este ejercicio se estiran los músculos de la parte de adelante del muslo (cuádriceps) sin mover la articulación de la rodilla (isométrico). °1. Recuéstese boca arriba con la pierna izquierda/derecha extendida y la rodilla opuesta flexionada. Coloque una toalla enrollada o un almohadón pequeño debajo de la  rodilla, según las indicaciones del médico. °2. Tensione lentamente los músculos de la   parte de adelante del muslo izquierdo/derecho. Deberá ver la rótula que se desliza para arriba hacia la cadera o que se profundizan los hoyuelos justo por encima de la rodilla. Este movimiento empujará la parte posterior de rodilla hacia el suelo. °3. Mantenga el músculo tan apretado como pueda sin aumentar el dolor durante __________ segundos. °4. Relaje los músculos lentamente y por completo. °Repita __________ veces. Realice este ejercicio __________ veces al día. °Elevaciones con la pierna extendida °Este ejercicio estira los músculos de la parte de adelante del muslo (cuádriceps) y los músculos que permiten mover las caderas (flexores de cadera). °1. Recuéstese boca arriba con la pierna izquierda/derecha extendida y la rodilla opuesta flexionada. °2. Tensione los músculos de la parte de adelante del muslo izquierdo/derecho. Deberá ver la rótula de la rodilla que se desliza hacia arriba o que se profundizan los hoyuelos justo por encima de la rodilla. El muslo podrá sacudirse un poco. °3. Mantenga estos músculos contraídos mientras levanta la pierna a una altura de 4 a 6 pulgadas (10 a 15 cm) del suelo. No deje que la rodilla se flexione. °4. Mantenga esta posición durante __________ segundos. °5. Mantenga estos músculos contraídos mientras baja la pierna. °6. Deje que sus músculos se relajen completamente después de cada repetición. °Repita __________ veces. Realice este ejercicio __________ veces al día. °Isquiotibiales, isometría °1. Acuéstese boca arriba sobre una superficie firme. °2. Flexione la rodilla izquierda/derecha aproximadamente __________ grados. °3. Clave el talón izquierdo/derecho en la superficie, como si estuviera tratando de tirar de él hacia los glúteos. Tense los músculos de la parte posterior de los muslos (isquiotibiales) para presionar tanto como pueda, sin aumentar el dolor. °4. Mantenga esta posición  durante __________ segundos. °5. Libere la tensión poco a poco y deje que sus músculos se relajen completamente durante __________ segundos entre cada repetición. °Repita __________ veces. Realice este ejercicio __________ veces al día. °Isquiotibiales con flexión °Realice este ejercicio con pesas en los tobillos si se lo ha indicado el médico. Comience con pesas de __________ libras. Luego aumente el peso en incrementos de 1 libra (0,5 kg). No use pesas en los tobillos que pesen más de __________ libras. °1. Recuéstese boca abajo con las piernas extendidas. °2. Flexione la rodilla izquierda/derecha lo más que pueda sin sentir dolor. Mantenga la cadera apoyada contra el suelo. °3. Mantenga esta posición durante __________ segundos. °4. Baje lentamente la pierna hasta la posición inicial. °Repita __________ veces. Realice este ejercicio __________ veces al día. °Cuclillas °Este ejercicio fortalece los músculos de la parte de adelante del muslo y la rodilla (cuádriceps). °1. Párese enfrente de una mesa, con los pies y las rodillas apuntando hacia adelante. Puede colocar las manos sobre la mesa para mantener el equilibrio, pero no para apoyarse. °2. Flexione lentamente las rodillas y baje la cadera, como si fuera a sentarse en una silla. °? Mantenga el peso sobre los talones, no sobre los dedos. °? Mantenga la parte inferior de las piernas rectas, de modo que queden paralelas a las patas de la mesa. °? No deje que la cadera quede más abajo que las rodillas. °? No flexione las rodillas más de lo indicado por el médico. °? Si el dolor aumenta, no las flexione tanto. °3. Mantenga la posición de cuclillas durante __________ segundos. °4. Haga fuerza con las piernas para pararse de nuevo. No use las manos para pararse. °Repita __________ veces. Realice este ejercicio __________ veces al día. °Deslizamientos sobre la pared °Este ejercicio fortalece los músculos de la parte de adelante del muslo y la   rodilla (cudriceps). 1.  Apoye la espalda contra una pared o una puerta, pero mantenga los pies a una distancia de entre 18 y 24pulgadas (65 y 61cm) de la pared o Counselling psychologist. 2. Separe los pies al ancho de caderas. 3. Deslcese lentamente hacia abajo, contra la pared o la puerta, de modo que sus rodillas queden flexionadas a __________ Marella Chimes. Mantenga las [employer], no sobre la punta de los pies. Mantenga las rodillas alineadas con las caderas. 4. Mantenga esta posicin durante __________ segundos. Repita __________ veces. Realice este ejercicio __________ veces al da. Elevaciones con la pierna extendida Este ejercicio fortalece los msculos que rotan la pierna en la cadera y la alejan del cuerpo (abductores de cadera). 1. Recustese de costado con la pierna izquierda/derecha arriba. Recustese de [employer] cabeza, los hombros, la rodilla y la cadera estn alineados. Puede flexionar la rodilla de abajo para mantener el equilibrio. 2. Mueva ligeramente las caderas hacia adelante, de modo que queden alineadas y la rodilla izquierda/derecha quede hacia adelante. 3. Con el taln como gua, levante la pierna de 4 a 6pulgadas (10 a 15cm). Debe sentir que se elevan los msculos de la parte externa de la cadera. ? No lleve el pie hacia adelante. ? No gire la rodilla hacia el techo. 4. Mantenga esta posicin durante __________ segundos. 5. Regrese lentamente la pierna a la posicin inicial. 6. Deje que los msculos se relajen completamente despus de cada repeticin. Repita __________ veces. Realice este ejercicio __________ veces al da. Elevaciones con la pierna extendida Este ejercicio [employer] que extienden las caderas desde la parte anterior de la pelvis (extensores de cadera). 1. Acustese boca abajo sobre una superficie firme. Puede colocar un almohadn debajo de la cadera si le resulta ms cmodo. 2. Tense los msculos de las nalgas para levantar la pierna izquierda/derecha unas 4 a  6pulgadas (10 a 15cm). Mantenga la rodilla estirada mientras eleva la pierna. 3. Mantenga esta posicin durante __________ segundos. 4. Baje lentamente la pierna hasta la posicin inicial. 5. Deje que la pierna se relaje completamente despus de cada repeticin. Repita __________ veces. Realice este ejercicio __________ veces al da. Esta informacin no tiene Marine scientist el consejo del mdico. Asegrese de hacerle al mdico cualquier pregunta que tenga. Document Released: 08/29/2005 Document Revised: 02/05/2018 Document Reviewed: 02/05/2018 Elsevier Patient Education  2020 [employer].

## 2019-02-26 NOTE — Progress Notes (Signed)
Office Visit Note   Patient: Danny Schneider           Date of Birth: 09-23-1970           MRN: [id number] Visit Date: 02/26/2019              Requested by: [name], NP [place],  [place] [address] PCP: [name], NP   Assessment & Plan: Visit Diagnoses:  1. Bilateral hand numbness   2. Chronic pain of right knee     Plan: He is given an appointment for nerve conduction studies.  We will see him back after the nerve conduction studies to go over these and discuss further treatment.  Regards to his knee due to the fact that he is slowly improving I did give him some exercises to do these are printed out in [place].  He will perform these exercises 3 sets of 10 both legs every other day.  Reviewed this information with interpreter today.  Regards to his back and like AP and lateral view of his lumbar spine whenever he returns.  We will do a formal work-up of his lumbar spine at that time.Questions encouraged and answered.   Follow-Up Instructions: Return After EMG/ [place] studies.   Orders:  No orders of the defined types were placed in this encounter.  No orders of the defined types were placed in this encounter.     Procedures: No procedures performed   Clinical Data: No additional findings.   Subjective: Chief Complaint  Patient presents with  . Left Wrist - Follow-up  . Right Knee - Follow-up    HPI Danny Schneider returns today accompanied by an interpreter.  He unfortunately did not have the nerve conduction studies of the upper extremities and his voicemail was not set up when our office tried to contact him.  In regards to his right knee he states that the knee is much better.  He does have 4 out of 10 pain medial aspect of the knee at the end of the day.  He is having no mechanical symptoms.  He is taking ibuprofen. Also he asked about his low back he has had low back pain with some popping in the back since he was a child.  He is having  no radicular symptoms down either leg.  He has had no new injury to the back. Review of Systems Please see HPI otherwise negative  Objective: Vital Signs: There were no vitals taken for this visit.  Physical Exam Constitutional:      Appearance: He is not ill-appearing or diaphoretic.  Pulmonary:     Effort: Pulmonary effort is normal.  Neurological:     Mental Status: He is alert and oriented to person, place, and time.  Psychiatric:        Mood and Affect: Mood normal.     Ortho Exam Right knee full extension full flexion.  Nontender along medial lateral joint line.  No abnormal warmth erythema or effusion.  Valgus varus stressing reveal no laxity.  Specialty Comments:  No specialty comments available.  Imaging: No results found.   PMFS History: Patient Active Problem List   Diagnosis Date Noted  . Hypertriglyceridemia 10/23/2018  . Pruritus of skin 09/14/2016   Past Medical History:  Diagnosis Date  . Hypertriglyceridemia     Family History  Problem Relation Age of Onset  . Diabetes Paternal Uncle     Past Surgical History:  Procedure Laterality Date  .  Right knee surgery     Social History   Occupational History  . Not on file  Tobacco Use  . Smoking status: Never Smoker  . Smokeless tobacco: Never Used  Substance and Sexual Activity  . Alcohol use: Yes  . Drug use: Not Currently    Types: Marijuana  . Sexual activity: Yes

## 2019-03-18 ENCOUNTER — Telehealth: Payer: Self-pay | Admitting: Physical Medicine and Rehabilitation

## 2019-03-18 NOTE — Telephone Encounter (Signed)
Called patient to reschedule. No answer and no voicemail option. We also need to reschedule her follow up with Dr. Ninfa Linden. She will not have her nerve study completed by 1/5.

## 2019-03-18 NOTE — Telephone Encounter (Signed)
Pt called in speak spanish, wants to cancel and reschedule her appt tomorrow I didn't transfer since she spoke spanish.  Please give her a call [phone]

## 2019-03-19 ENCOUNTER — Encounter: Payer: Self-pay | Admitting: Physical Medicine and Rehabilitation

## 2019-03-20 ENCOUNTER — Encounter: Payer: Self-pay | Admitting: Physical Medicine and Rehabilitation

## 2019-03-21 NOTE — Telephone Encounter (Signed)
Can you call patient to reschedule both appointments?

## 2019-03-21 NOTE — Telephone Encounter (Signed)
Pt is scheduled for NCS 04/16/19 and F/U with Dr. Ninfa Linden post NCS 04/23/19

## 2019-03-26 ENCOUNTER — Ambulatory Visit: Payer: Self-pay | Admitting: Orthopaedic Surgery

## 2019-04-16 ENCOUNTER — Encounter: Payer: Self-pay | Admitting: Physical Medicine and Rehabilitation

## 2019-04-16 ENCOUNTER — Ambulatory Visit (INDEPENDENT_AMBULATORY_CARE_PROVIDER_SITE_OTHER): Payer: Self-pay | Admitting: Physical Medicine and Rehabilitation

## 2019-04-16 ENCOUNTER — Other Ambulatory Visit: Payer: Self-pay

## 2019-04-16 DIAGNOSIS — R202 Paresthesia of skin: Secondary | ICD-10-CM

## 2019-04-16 NOTE — Progress Notes (Signed)
 .  Numeric Pain Rating Scale and Functional Assessment Average Pain 8   In the last MONTH (on 0-10 scale) has pain interfered with the following?  1. General activity like being  able to carry out your everyday physical activities such as walking, climbing stairs, carrying groceries, or moving a chair?  Rating(8)   

## 2019-04-18 NOTE — Procedures (Signed)
EMG & NCV Findings: Evaluation of the left median motor and the right median motor nerves showed prolonged distal onset latency (L4.6, R5.9 ms), reduced amplitude (L3.1, R4.3 mV), and decreased conduction velocity (Elbow-Wrist, L42, R46 m/s).  The left median (across palm) sensory and the right median (across palm) sensory nerves showed prolonged distal peak latency (Wrist, L6.5, R8.3 ms), reduced amplitude (L5.3, R3.9 V), and prolonged distal peak latency (Palm, L2.9, R2.5 ms).  All remaining nerves (as indicated in the following tables) were within normal limits.  Left vs. Right side comparison data for the median motor nerve indicates abnormal L-R latency difference (1.3 ms).  All remaining left vs. right side differences were within normal limits.    All examined muscles (as indicated in the following table) showed no evidence of electrical instability.    Impression: The above electrodiagnostic study is ABNORMAL and reveals evidence of a severe BILATERAL median nerve entrapment at the wrist (carpal tunnel syndrome) affecting sensory and motor components.  With fairly normal EMG findings this portends a better prognostic outcome with decompression.  There is also incidental finding of a Martin-Gruber anastomosis which is a normal variant.  There is no significant electrodiagnostic evidence of any other focal nerve entrapment, brachial plexopathy or cervical radiculopathy.    Recommendations: 1.  Follow-up with referring physician. 2.  Continue current management of symptoms. 3.  Suggest surgical evaluation.  ___________________________ Naaman Plummer FAAPMR Board Certified, American Board of Physical Medicine and Rehabilitation    Nerve Conduction Studies Anti Sensory Summary Table   Stim Site NR Peak (ms) Norm Peak (ms) P-T Amp (V) Norm P-T Amp Site1 Site2 Delta-P (ms) Dist (cm) Vel (m/s) Norm Vel (m/s)  Left Median Acr Palm Anti Sensory (2nd Digit)  32C  Wrist    *6.5 <3.6 *5.3 >10  Wrist Palm 3.6 0.0    Palm    *2.9 <2.0 2.4         Right Median Acr Palm Anti Sensory (2nd Digit)  30.7C  Wrist    *8.3 <3.6 *3.9 >10 Wrist Palm 5.8 0.0    Palm    *2.5 <2.0 0.8         Left Radial Anti Sensory (Base 1st Digit)  31.7C  Wrist    1.9 <3.1 29.1  Wrist Base 1st Digit 1.9 0.0    Right Radial Anti Sensory (Base 1st Digit)  30.8C  Wrist    2.1 <3.1 23.6  Wrist Base 1st Digit 2.1 0.0    Left Ulnar Anti Sensory (5th Digit)  32.4C  Wrist    3.0 <3.7 22.6 >15.0 Wrist 5th Digit 3.0 14.0 47 >38  Right Ulnar Anti Sensory (5th Digit)  31C  Wrist    3.0 <3.7 16.1 >15.0 Wrist 5th Digit 3.0 14.0 47 >38   Motor Summary Table   Stim Site NR Onset (ms) Norm Onset (ms) O-P Amp (mV) Norm O-P Amp Site1 Site2 Delta-0 (ms) Dist (cm) Vel (m/s) Norm Vel (m/s)  Left Median Motor (Abd Poll Brev)  31.8C  Wrist    *4.6 <4.2 *3.1 >5 Elbow Wrist 4.8 20.0 *42 >50  Elbow    9.4  4.5         Right Median Motor (Abd Poll Brev)  30.9C    Martin-Gruber  Wrist    *5.9 <4.2 *4.3 >5 Elbow Wrist 4.6 21.0 *46 >50  Elbow    10.5  4.4         Left Ulnar Motor (Abd Dig Min)  32.2C  Wrist    2.5 <4.2 11.5 >3 B Elbow Wrist 3.4 18.5 54 >53  B Elbow    5.9  10.9  A Elbow B Elbow 1.3 10.0 77 >53  A Elbow    7.2  10.6         Right Ulnar Motor (Abd Dig Min)  31.1C  Wrist    2.5 <4.2 11.1 >3 B Elbow Wrist 3.4 19.0 56 >53  B Elbow    5.9  8.8  A Elbow B Elbow 1.2 10.0 83 >53  A Elbow    7.1  8.7          EMG   Side Muscle Nerve Root Ins Act Fibs Psw Amp Dur Poly Recrt Int Fraser Din Comment  Left Abd Poll Brev Median C8-T1 Nml Nml Nml Nml Nml 0 Nml Nml   Left 1stDorInt Ulnar C8-T1 Nml Nml Nml Nml Nml 0 Nml Nml     Nerve Conduction Studies Anti Sensory Left/Right Comparison   Stim Site L Lat (ms) R Lat (ms) L-R Lat (ms) L Amp (V) R Amp (V) L-R Amp (%) Site1 Site2 L Vel (m/s) R Vel (m/s) L-R Vel (m/s)  Median Acr Palm Anti Sensory (2nd Digit)  32C  Wrist *6.5 *8.3 1.8 *5.3 *3.9 26.4 Wrist Palm     Palm  *2.9 *2.5 0.4 2.4 0.8 66.7       Radial Anti Sensory (Base 1st Digit)  31.7C  Wrist 1.9 2.1 0.2 29.1 23.6 18.9 Wrist Base 1st Digit     Ulnar Anti Sensory (5th Digit)  32.4C  Wrist 3.0 3.0 0.0 22.6 16.1 28.8 Wrist 5th Digit 47 47 0   Motor Left/Right Comparison   Stim Site L Lat (ms) R Lat (ms) L-R Lat (ms) L Amp (mV) R Amp (mV) L-R Amp (%) Site1 Site2 L Vel (m/s) R Vel (m/s) L-R Vel (m/s)  Median Motor (Abd Poll Brev)  31.8C  Wrist *4.6 *5.9 *1.3 *3.1 *4.3 27.9 Elbow Wrist *42 *46 4  Elbow 9.4 10.5 1.1 4.5 4.4 2.2       Ulnar Motor (Abd Dig Min)  32.2C  Wrist 2.5 2.5 0.0 11.5 11.1 3.5 B Elbow Wrist 54 56 2  B Elbow 5.9 5.9 0.0 10.9 8.8 19.3 A Elbow B Elbow 77 83 6  A Elbow 7.2 7.1 0.1 10.6 8.7 17.9          Waveforms:

## 2019-04-18 NOTE — Progress Notes (Signed)
Danny Schneider - 49 y.o. male MRN [id number]  Date of birth: [birth year]  Office Visit Note: Visit Date: 04/16/2019 PCP: [name], NP Referred by: [name], NP  Subjective: Chief Complaint  Patient presents with  . Right Hand - Numbness  . Left Hand - Numbness   HPI: Danny Schneider is a 49 y.o. male who comes in today At the request of Dr. Jean Rosenthal for electrodiagnostic study of both upper limbs.  Patient is right-hand dominant and complains of worsening severe numbness and weakness in both hands.  He is present today with a Spanish interpreter.  He rates his average pain as an 8 out of 10.  He reports that wearing wrist braces has helped to a small degree and that he has worsening symptoms with using the hands worse at night and worse with driving.  He has not had prior electrodiagnostic studies.  He has had no prior cervical surgery.  He does not have diabetes.  ROS Otherwise per HPI.  Assessment & Plan: Visit Diagnoses:  1. Paresthesia of skin     Plan: Impression: The above electrodiagnostic study is ABNORMAL and reveals evidence of a severe BILATERAL median nerve entrapment at the wrist (carpal tunnel syndrome) affecting sensory and motor components.  With fairly normal EMG findings this portends a better prognostic outcome with decompression.  There is also incidental finding of a Martin-Gruber anastomosis which is a normal variant.  There is no significant electrodiagnostic evidence of any other focal nerve entrapment, brachial plexopathy or cervical radiculopathy.    Recommendations: 1.  Follow-up with referring physician. 2.  Continue current management of symptoms. 3.  Suggest surgical evaluation.    Meds & Orders: No orders of the defined types were placed in this encounter.   Orders Placed This Encounter  Procedures  . NCV with EMG (electromyography)    Follow-up: Return for Jean Rosenthal, M.D..   Procedures: No  procedures performed  EMG & NCV Findings: Evaluation of the left median motor and the right median motor nerves showed prolonged distal onset latency (L4.6, R5.9 ms), reduced amplitude (L3.1, R4.3 mV), and decreased conduction velocity (Elbow-Wrist, L42, R46 m/s).  The left median (across palm) sensory and the right median (across palm) sensory nerves showed prolonged distal peak latency (Wrist, L6.5, R8.3 ms), reduced amplitude (L5.3, R3.9 V), and prolonged distal peak latency (Palm, L2.9, R2.5 ms).  All remaining nerves (as indicated in the following tables) were within normal limits.  Left vs. Right side comparison data for the median motor nerve indicates abnormal L-R latency difference (1.3 ms).  All remaining left vs. right side differences were within normal limits.    All examined muscles (as indicated in the following table) showed no evidence of electrical instability.    Impression: The above electrodiagnostic study is ABNORMAL and reveals evidence of a severe BILATERAL median nerve entrapment at the wrist (carpal tunnel syndrome) affecting sensory and motor components.  With fairly normal EMG findings this portends a better prognostic outcome with decompression.  There is also incidental finding of a Martin-Gruber anastomosis which is a normal variant.  There is no significant electrodiagnostic evidence of any other focal nerve entrapment, brachial plexopathy or cervical radiculopathy.    Recommendations: 1.  Follow-up with referring physician. 2.  Continue current management of symptoms. 3.  Suggest surgical evaluation.  ___________________________ Wonda Olds Board Certified, American Board of Physical Medicine and Rehabilitation    Nerve Conduction Studies Anti Sensory Summary Table  Stim Site NR Peak (ms) Norm Peak (ms) P-T Amp (V) Norm P-T Amp Site1 Site2 Delta-P (ms) Dist (cm) Vel (m/s) Norm Vel (m/s)  Left Median Acr Palm Anti Sensory (2nd Digit)  32C  Wrist     *6.5 <3.6 *5.3 >10 Wrist Palm 3.6 0.0    Palm    *2.9 <2.0 2.4         Right Median Acr Palm Anti Sensory (2nd Digit)  30.7C  Wrist    *8.3 <3.6 *3.9 >10 Wrist Palm 5.8 0.0    Palm    *2.5 <2.0 0.8         Left Radial Anti Sensory (Base 1st Digit)  31.7C  Wrist    1.9 <3.1 29.1  Wrist Base 1st Digit 1.9 0.0    Right Radial Anti Sensory (Base 1st Digit)  30.8C  Wrist    2.1 <3.1 23.6  Wrist Base 1st Digit 2.1 0.0    Left Ulnar Anti Sensory (5th Digit)  32.4C  Wrist    3.0 <3.7 22.6 >15.0 Wrist 5th Digit 3.0 14.0 47 >38  Right Ulnar Anti Sensory (5th Digit)  31C  Wrist    3.0 <3.7 16.1 >15.0 Wrist 5th Digit 3.0 14.0 47 >38   Motor Summary Table   Stim Site NR Onset (ms) Norm Onset (ms) O-P Amp (mV) Norm O-P Amp Site1 Site2 Delta-0 (ms) Dist (cm) Vel (m/s) Norm Vel (m/s)  Left Median Motor (Abd Poll Brev)  31.8C  Wrist    *4.6 <4.2 *3.1 >5 Elbow Wrist 4.8 20.0 *42 >50  Elbow    9.4  4.5         Right Median Motor (Abd Poll Brev)  30.9C    Martin-Gruber  Wrist    *5.9 <4.2 *4.3 >5 Elbow Wrist 4.6 21.0 *46 >50  Elbow    10.5  4.4         Left Ulnar Motor (Abd Dig Min)  32.2C  Wrist    2.5 <4.2 11.5 >3 B Elbow Wrist 3.4 18.5 54 >53  B Elbow    5.9  10.9  A Elbow B Elbow 1.3 10.0 77 >53  A Elbow    7.2  10.6         Right Ulnar Motor (Abd Dig Min)  31.1C  Wrist    2.5 <4.2 11.1 >3 B Elbow Wrist 3.4 19.0 56 >53  B Elbow    5.9  8.8  A Elbow B Elbow 1.2 10.0 83 >53  A Elbow    7.1  8.7          EMG   Side Muscle Nerve Root Ins Act Fibs Psw Amp Dur Poly Recrt Int Dennie Bible Comment  Left Abd Poll Brev Median C8-T1 Nml Nml Nml Nml Nml 0 Nml Nml   Left 1stDorInt Ulnar C8-T1 Nml Nml Nml Nml Nml 0 Nml Nml     Nerve Conduction Studies Anti Sensory Left/Right Comparison   Stim Site L Lat (ms) R Lat (ms) L-R Lat (ms) L Amp (V) R Amp (V) L-R Amp (%) Site1 Site2 L Vel (m/s) R Vel (m/s) L-R Vel (m/s)  Median Acr Palm Anti Sensory (2nd Digit)  32C  Wrist *6.5 *8.3 1.8 *5.3 *3.9 26.4  Wrist Palm     Palm *2.9 *2.5 0.4 2.4 0.8 66.7       Radial Anti Sensory (Base 1st Digit)  31.7C  Wrist 1.9 2.1 0.2 29.1 23.6 18.9 Wrist Base 1st Digit     Ulnar  Anti Sensory (5th Digit)  32.4C  Wrist 3.0 3.0 0.0 22.6 16.1 28.8 Wrist 5th Digit 47 47 0   Motor Left/Right Comparison   Stim Site L Lat (ms) R Lat (ms) L-R Lat (ms) L Amp (mV) R Amp (mV) L-R Amp (%) Site1 Site2 L Vel (m/s) R Vel (m/s) L-R Vel (m/s)  Median Motor (Abd Poll Brev)  31.8C  Wrist *4.6 *5.9 *1.3 *3.1 *4.3 27.9 Elbow Wrist *42 *46 4  Elbow 9.4 10.5 1.1 4.5 4.4 2.2       Ulnar Motor (Abd Dig Min)  32.2C  Wrist 2.5 2.5 0.0 11.5 11.1 3.5 B Elbow Wrist 54 56 2  B Elbow 5.9 5.9 0.0 10.9 8.8 19.3 A Elbow B Elbow 77 83 6  A Elbow 7.2 7.1 0.1 10.6 8.7 17.9          Waveforms:                      Clinical History: No specialty comments available.   He reports that he has never smoked. He has never used smokeless tobacco.  Recent Labs    11/20/18 1145  HGBA1C 5.3    Objective:  VS:  HT:    WT:   BMI:     BP:   HR: bpm  TEMP: ( )  RESP:  Physical Exam Musculoskeletal:        General: No tenderness.     Comments: Inspection reveals no atrophy of the bilateral APB or FDI or hand intrinsics. There is no swelling, color changes, allodynia or dystrophic changes. There is 5 out of 5 strength in the bilateral wrist extension, finger abduction and long finger flexion. There is intact sensation to light touch in all dermatomal and peripheral nerve distributions.   Skin:    General: Skin is warm and dry.     Findings: No erythema or rash.  Neurological:     General: No focal deficit present.     Mental Status: He is alert and oriented to person, place, and time.     Sensory: No sensory deficit.     Motor: No weakness or abnormal muscle tone.     Coordination: Coordination normal.     Gait: Gait normal.  Psychiatric:        Mood and Affect: Mood normal.        Behavior: Behavior normal.         Thought Content: Thought content normal.     Ortho Exam Imaging: No results found.  Past Medical/Family/Surgical/Social History: Medications & Allergies reviewed per EMR, new medications updated. Patient Active Problem List   Diagnosis Date Noted  . Hypertriglyceridemia 10/23/2018  . Pruritus of skin 09/14/2016   Past Medical History:  Diagnosis Date  . Hypertriglyceridemia    Family History  Problem Relation Age of Onset  . Diabetes Paternal Uncle    Past Surgical History:  Procedure Laterality Date  . Right knee surgery     Social History   Occupational History  . Not on file  Tobacco Use  . Smoking status: Never Smoker  . Smokeless tobacco: Never Used  Substance and Sexual Activity  . Alcohol use: Yes  . Drug use: Not Currently    Types: Marijuana  . Sexual activity: Yes

## 2019-04-23 ENCOUNTER — Ambulatory Visit (INDEPENDENT_AMBULATORY_CARE_PROVIDER_SITE_OTHER): Payer: Self-pay

## 2019-04-23 ENCOUNTER — Encounter: Payer: Self-pay | Admitting: Orthopaedic Surgery

## 2019-04-23 ENCOUNTER — Ambulatory Visit (INDEPENDENT_AMBULATORY_CARE_PROVIDER_SITE_OTHER): Payer: Self-pay | Admitting: Orthopaedic Surgery

## 2019-04-23 DIAGNOSIS — G5602 Carpal tunnel syndrome, left upper limb: Secondary | ICD-10-CM

## 2019-04-23 DIAGNOSIS — M545 Low back pain, unspecified: Secondary | ICD-10-CM

## 2019-04-23 DIAGNOSIS — G5601 Carpal tunnel syndrome, right upper limb: Secondary | ICD-10-CM

## 2019-04-23 NOTE — Progress Notes (Signed)
Office Visit Note   Patient: Danny Schneider           Date of Birth: 06/01/1970           MRN: [id number] Visit Date: 04/23/2019              Requested by: Danny Pounds, NP [place],  [place] [id number] PCP: Danny Pounds, NP   Assessment & Plan: Visit Diagnoses:  1. Low back pain, unspecified back pain laterality, unspecified chronicity, unspecified whether sciatica present   2. Carpal tunnel syndrome, left upper limb   3. Carpal tunnel syndrome, right upper limb     Plan:  We will send him to physical therapy for core strengthening, hamstring stretching, modalities and back exercises.  In regards to his carpal tunnel syndrome which is severe bilaterally recommend that have carpal tunnel release.  Recommend he start with the most symptomatic hand.  That being his left hand with start of this him first.  Discussed with him postoperative protocol and the surgical procedure.  Risk benefits surgery discussed with patient.  Interpreter was used to communicate with the patient today.  Questions were encouraged and answered by Dr. Ninfa Linden myself.  We will see him 2 weeks postop.  Follow-Up Instructions: Return 2 weeks postop.   Orders:  Orders Placed This Encounter  Procedures  . XR Lumbar Spine 2-3 Views   No orders of the defined types were placed in this encounter.     Procedures: No procedures performed   Clinical Data: No additional findings.   Subjective: Chief Complaint  Patient presents with  . Left Hand - Follow-up  . Right Hand - Follow-up    HPI [name] returns today to go over the EMG nerve conduction studies of his upper extremities.  Still having pain in both hands.  Is also asking for work-up for his low back pain.  Has had chronic low back pain for years.  Pain is back awakens him.  Is worse when he is lying down.  No radicular symptoms.  He also notes pain is worse with twisting turning.  Pain is 8 out of 10 pain worse.  He seen  a chiropractor for this.  No medications. EMG nerve conduction studies showed severe carpal tunnel syndrome bilaterally.  Interpreter is present today to communicate with the patient who speaks Spanish. Review of Systems Negative for fevers chills.  Please see HPI otherwise negative or noncontributory.  Objective: Vital Signs: There were no vitals taken for this visit.  Physical Exam Constitutional:      Appearance: He is normal weight. He is not ill-appearing or diaphoretic.  Cardiovascular:     Pulses: Normal pulses.  Pulmonary:     Effort: Pulmonary effort is normal.  Neurological:     Mental Status: He is alert and oriented to person, place, and time.  Psychiatric:        Mood and Affect: Mood normal.        Behavior: Behavior normal.     Ortho Exam Out of 5 strength throughout the lower extremities against resistance negative straight leg raise bilaterally.  He lacks approximately 2 inches of being able to touch his toes.  Has no pain with flexion-extension of the lumbar spine.  Nontender with palpation of the lumbar spine and paraspinous region.  Good range of motion bilateral hips without pain.  Deep tendon reflexes are 2+ at the knees and ankles and equal and symmetric.  Sensation grossly intact bilateral  feet to light touch. Specialty Comments:  No specialty comments available.  Imaging: XR Lumbar Spine 2-3 Views  Result Date: 04/23/2019 AP lateral views lumbar spine: Normal lordotic curvature is well-maintained.  No significant arthritic changes.  No acute fractures no spondylolisthesis.  No bony abnormalities.    PMFS History: Patient Active Problem List   Diagnosis Date Noted  . Hypertriglyceridemia 10/23/2018  . Pruritus of skin 09/14/2016   Past Medical History:  Diagnosis Date  . Hypertriglyceridemia     Family History  Problem Relation Age of Onset  . Diabetes Paternal Uncle     Past Surgical History:  Procedure Laterality Date  . Right knee surgery      Social History   Occupational History  . Not on file  Tobacco Use  . Smoking status: Never Smoker  . Smokeless tobacco: Never Used  Substance and Sexual Activity  . Alcohol use: Yes  . Drug use: Not Currently    Types: Marijuana  . Sexual activity: Yes

## 2019-04-23 NOTE — Addendum Note (Signed)
Addended by: Mardene Celeste B on: 04/23/2019 12:15 PM   Modules accepted: Orders

## 2019-04-25 MED FILL — IBUPROFEN 600 MG TABLET: 600 | 20 days supply | Qty: 60 | Fill #1

## 2019-05-28 ENCOUNTER — Ambulatory Visit: Payer: Self-pay | Attending: Nurse Practitioner | Admitting: Nurse Practitioner

## 2019-05-28 ENCOUNTER — Other Ambulatory Visit: Payer: Self-pay

## 2019-05-28 ENCOUNTER — Encounter: Payer: Self-pay | Admitting: Nurse Practitioner

## 2019-05-28 DIAGNOSIS — E781 Pure hyperglyceridemia: Secondary | ICD-10-CM

## 2019-05-28 DIAGNOSIS — L299 Pruritus, unspecified: Secondary | ICD-10-CM

## 2019-05-28 DIAGNOSIS — L309 Dermatitis, unspecified: Secondary | ICD-10-CM

## 2019-05-28 MED ORDER — TRIAMCINOLONE ACETONIDE 0.1 % EX OINT: 1.0000 "application " | TOPICAL_OINTMENT | Freq: Two times a day (BID) | CUTANEOUS | 1 refills | Status: DC

## 2019-05-28 MED FILL — TRIAMCINOLONE 0.1% OINTMENT: 0.1 | 7 days supply | Qty: 60 | Fill #0

## 2019-05-28 NOTE — Progress Notes (Signed)
Virtual Visit via Telephone Note Due to national recommendations of social distancing due to [place] 19, telehealth visit is felt to be most appropriate for this patient at this time.  I discussed the limitations, risks, security and privacy concerns of performing an evaluation and management service by telephone and the availability of in person appointments. I also discussed with the patient that there may be a patient responsible charge related to this service. The patient expressed understanding and agreed to proceed.    I connected with Danny Schneider on 05/28/19  at   9:50 AM EST  EDT by telephone and verified that I am speaking with the correct person using two identifiers.   Consent I discussed the limitations, risks, security and privacy concerns of performing an evaluation and management service by telephone and the availability of in person appointments. I also discussed with the patient that there may be a patient responsible charge related to this service. The patient expressed understanding and agreed to proceed.   Location of Patient: Private  Residence   Location of Provider: [place] [facility] [place] participating in Telemedicine visit: Geryl Rankins FNP-BC [place]  Spanish ID#  Berniece    History of Present Illness: Telemedicine visit for: Pruritis  Pruritus Endorses pruritus of his back after taking showers. There are no visible rashes or lesions on his back per his report. I have instructed him to monitor the temperature of the water when he takes a shower making sure it is not too hot as well as moisturizing after showering.    SKIN RASH Has concerns of bilateral arm rash: dry, scaly, pruritic. Chronic. Has tried OTC hydrocortisone with no improvement in rash.      Past Medical History:  Diagnosis Date  . Hypertriglyceridemia     Past Surgical History:  Procedure Laterality Date  . Right knee  surgery      Family History  Problem Relation Age of Onset  . Diabetes Paternal Uncle     Social History   Socioeconomic History  . Marital status: Married    Spouse name: Not on file  . Number of children: Not on file  . Years of education: Not on file  . Highest education level: Not on file  Occupational History  . Not on file  Tobacco Use  . Smoking status: Never Smoker  . Smokeless tobacco: Never Used  Substance and Sexual Activity  . Alcohol use: Yes  . Drug use: Not Currently    Types: Marijuana  . Sexual activity: Yes  Other Topics Concern  . Not on file  Social History Narrative  . Not on file   Social Determinants of Health   Financial Resource Strain:   . Difficulty of Paying Living Expenses:   Food Insecurity:   . Worried About Charity fundraiser in the Last Year:   . Arboriculturist in the Last Year:   Transportation Needs:   . Film/video editor (Medical):   Marland Kitchen Lack of Transportation (Non-Medical):   Physical Activity:   . Days of Exercise per Week:   . Minutes of Exercise per Session:   Stress:   . Feeling of Stress :   Social Connections:   . Frequency of Communication with Friends and Family:   . Frequency of Social Gatherings with Friends and Family:   . Attends Religious Services:   . Active Member of Clubs or Organizations:   . Attends  Club or Organization Meetings:   Marland Kitchen Marital Status:      Observations/Objective: Awake, alert and oriented x 3   Review of Systems  Constitutional: Negative for fever, malaise/fatigue and weight loss.  HENT: Negative.  Negative for nosebleeds.   Eyes: Negative.  Negative for blurred vision, double vision and photophobia.  Respiratory: Negative.  Negative for cough and shortness of breath.   Cardiovascular: Negative.  Negative for chest pain, palpitations and leg swelling.  Gastrointestinal: Negative.  Negative for heartburn, nausea and vomiting.  Musculoskeletal: Negative.  Negative for myalgias.   Skin: Positive for rash.       SEE HPI  Neurological: Negative.  Negative for dizziness, focal weakness, seizures and headaches.  Psychiatric/Behavioral: Negative.  Negative for suicidal ideas.    Assessment and Plan: Aniket was seen today for itchy skin.  Diagnoses and all orders for this visit:  Pruritus of skin Warm Showers Avoid Hot showers or baths Moisturize your skin after bathing or showering  Dermatitis -     triamcinolone ointment (KENALOG) 0.1 %; Apply 1 application topically 2 (two) times daily.  Hypertriglyceridemia -     CBC; Future -     CMP14+EGFR; Future -     Lipid panel; Future -     TSH; Future     Follow Up Instructions No follow-ups on file.     I discussed the assessment and treatment plan with the patient. The patient was provided an opportunity to ask questions and all were answered. The patient agreed with the plan and demonstrated an understanding of the instructions.   The patient was advised to call back or seek an in-person evaluation if the symptoms worsen or if the condition fails to improve as anticipated.  I provided 13 minutes of non-face-to-face time during this encounter including median intraservice time, reviewing previous notes, labs, imaging, medications and explaining diagnosis and management.  [name], FNP-BC

## 2019-06-04 ENCOUNTER — Other Ambulatory Visit: Payer: Self-pay

## 2019-06-04 ENCOUNTER — Ambulatory Visit: Payer: Self-pay | Attending: Nurse Practitioner

## 2019-06-04 DIAGNOSIS — E781 Pure hyperglyceridemia: Secondary | ICD-10-CM

## 2019-06-05 LAB — CMP14+EGFR
ALT: 37 IU/L (ref 0–44)
AST: 29 IU/L (ref 0–40)
Albumin/Globulin Ratio: 1.8 (ref 1.2–2.2)
Albumin: 4.8 g/dL (ref 4.0–5.0)
Alkaline Phosphatase: 90 IU/L (ref 39–117)
BUN/Creatinine Ratio: 16 (ref 9–20)
BUN: 11 mg/dL (ref 6–24)
Bilirubin Total: 0.6 mg/dL (ref 0.0–1.2)
CO2: 24 mmol/L (ref 20–29)
Calcium: 9.1 mg/dL (ref 8.7–10.2)
Chloride: 103 mmol/L (ref 96–106)
Creatinine, Ser: 0.67 mg/dL — ABNORMAL LOW (ref 0.76–1.27)
GFR calc Af Amer: 130 mL/min/{1.73_m2} (ref >59)
GFR calc non Af Amer: 113 mL/min/{1.73_m2} (ref >59)
Globulin, Total: 2.6 g/dL (ref 1.5–4.5)
Glucose: 95 mg/dL (ref 65–99)
Potassium: 4.5 mmol/L (ref 3.5–5.2)
Sodium: 141 mmol/L (ref 134–144)
Total Protein: 7.4 g/dL (ref 6.0–8.5)

## 2019-06-05 LAB — CBC
Hematocrit: 47.3 % (ref 37.5–51.0)
Hemoglobin: 16.5 g/dL (ref 13.0–17.7)
MCH: 31.8 pg (ref 26.6–33.0)
MCHC: 34.9 g/dL (ref 31.5–35.7)
MCV: 91 fL (ref 79–97)
Platelets: 154 10*3/uL (ref 150–450)
RBC: 5.19 x10E6/uL (ref 4.14–5.80)
RDW: 12.3 % (ref 11.6–15.4)
WBC: 4.8 10*3/uL (ref 3.4–10.8)

## 2019-06-05 LAB — LIPID PANEL
Chol/HDL Ratio: 4.6 ratio (ref 0.0–5.0)
Cholesterol, Total: 207 mg/dL — ABNORMAL HIGH (ref 100–199)
HDL: 45 mg/dL (ref >39)
LDL Chol Calc (NIH): 137 mg/dL — ABNORMAL HIGH (ref 0–99)
Triglycerides: 139 mg/dL (ref 0–149)
VLDL Cholesterol Cal: 25 mg/dL (ref 5–40)

## 2019-06-05 LAB — TSH: TSH: 2.09 u[IU]/mL (ref 0.450–4.500)

## 2019-06-15 ENCOUNTER — Encounter: Payer: Self-pay | Admitting: Nurse Practitioner

## 2019-06-27 ENCOUNTER — Ambulatory Visit: Payer: Self-pay | Admitting: Nurse Practitioner

## 2019-07-02 ENCOUNTER — Ambulatory Visit: Payer: Self-pay

## 2019-07-02 ENCOUNTER — Other Ambulatory Visit: Payer: Self-pay

## 2019-07-08 MED FILL — TRIAMCINOLONE 0.1% OINTMENT: 0.1 | 7 days supply | Qty: 30 | Fill #1

## 2019-07-16 ENCOUNTER — Ambulatory Visit: Payer: Self-pay | Admitting: Orthopaedic Surgery

## 2019-07-18 ENCOUNTER — Encounter (HOSPITAL_BASED_OUTPATIENT_CLINIC_OR_DEPARTMENT_OTHER): Payer: Self-pay | Admitting: Orthopaedic Surgery

## 2019-07-18 ENCOUNTER — Other Ambulatory Visit: Payer: Self-pay

## 2019-07-22 ENCOUNTER — Other Ambulatory Visit (HOSPITAL_COMMUNITY)
Admission: RE | Admit: 2019-07-22 | Discharge: 2019-07-22 | Disposition: A | Discharge status: Home / Self Care | Payer: Self-pay | Source: Ambulatory Visit | Attending: Orthopaedic Surgery | Admitting: Orthopaedic Surgery

## 2019-07-22 ENCOUNTER — Other Ambulatory Visit: Payer: Self-pay | Admitting: Physician Assistant

## 2019-07-22 DIAGNOSIS — Z01812 Encounter for preprocedural laboratory examination: Secondary | ICD-10-CM | POA: Insufficient documentation

## 2019-07-22 DIAGNOSIS — Z20822 Contact with and (suspected) exposure to covid-19: Secondary | ICD-10-CM | POA: Insufficient documentation

## 2019-07-22 LAB — SARS CORONAVIRUS 2 (TAT 6-24 HRS): SARS Coronavirus 2: NEGATIVE

## 2019-07-23 ENCOUNTER — Ambulatory Visit: Payer: Self-pay | Admitting: Nurse Practitioner

## 2019-07-23 MED FILL — TRIAMCINOLONE 0.1% OINTMENT: 0.1 | 7 days supply | Qty: 30 | Fill #2

## 2019-07-25 ENCOUNTER — Other Ambulatory Visit: Payer: Self-pay | Admitting: Orthopaedic Surgery

## 2019-07-25 ENCOUNTER — Other Ambulatory Visit: Payer: Self-pay

## 2019-07-25 ENCOUNTER — Encounter (HOSPITAL_BASED_OUTPATIENT_CLINIC_OR_DEPARTMENT_OTHER): Payer: Self-pay | Admitting: Orthopaedic Surgery

## 2019-07-25 ENCOUNTER — Ambulatory Visit (HOSPITAL_BASED_OUTPATIENT_CLINIC_OR_DEPARTMENT_OTHER): Payer: Self-pay | Admitting: Certified Registered"

## 2019-07-25 ENCOUNTER — Ambulatory Visit (HOSPITAL_BASED_OUTPATIENT_CLINIC_OR_DEPARTMENT_OTHER)
Admission: RE | Admit: 2019-07-25 | Discharge: 2019-07-25 | Disposition: A | Discharge status: Home / Self Care | Payer: Self-pay | Attending: Orthopaedic Surgery | Admitting: Orthopaedic Surgery

## 2019-07-25 ENCOUNTER — Encounter (HOSPITAL_BASED_OUTPATIENT_CLINIC_OR_DEPARTMENT_OTHER)
Admission: RE | Disposition: A | Discharge status: Home / Self Care | Payer: Self-pay | Source: Home / Self Care | Attending: Orthopaedic Surgery

## 2019-07-25 DIAGNOSIS — E781 Pure hyperglyceridemia: Secondary | ICD-10-CM | POA: Insufficient documentation

## 2019-07-25 DIAGNOSIS — G5601 Carpal tunnel syndrome, right upper limb: Secondary | ICD-10-CM

## 2019-07-25 HISTORY — PX: CARPAL TUNNEL RELEASE: SHX101

## 2019-07-25 SURGERY — CARPAL TUNNEL RELEASE
Anesthesia: Regional | Site: Hand | Laterality: Right

## 2019-07-25 MED ORDER — FENTANYL CITRATE (PF) 100 MCG/2ML IJ SOLN
INTRAMUSCULAR | Status: AC
  Filled 2019-07-25: qty 2

## 2019-07-25 MED ORDER — HYDROCODONE-ACETAMINOPHEN 5-325 MG PO TABS: 1.0000 | ORAL_TABLET | Freq: Four times a day (QID) | ORAL | 0 refills | Status: DC | PRN

## 2019-07-25 MED ORDER — CEFAZOLIN SODIUM-DEXTROSE 2-4 GM/100ML-% IV SOLN
2.0000 g | INTRAVENOUS | Status: AC
  Administered 2019-07-25: 2 g via INTRAVENOUS

## 2019-07-25 MED ORDER — MEPERIDINE HCL 25 MG/ML IJ SOLN: 6.2500 mg | INTRAMUSCULAR | Status: DC | PRN

## 2019-07-25 MED ORDER — LACTATED RINGERS IV SOLN: INTRAVENOUS | Status: DC

## 2019-07-25 MED ORDER — CEFAZOLIN SODIUM-DEXTROSE 2-4 GM/100ML-% IV SOLN
INTRAVENOUS | Status: AC
  Filled 2019-07-25: qty 100

## 2019-07-25 MED ORDER — BUPIVACAINE HCL (PF) 0.25 % IJ SOLN
INTRAMUSCULAR | Status: DC | PRN
  Administered 2019-07-25: 10 mL

## 2019-07-25 MED ORDER — OXYCODONE HCL 5 MG PO TABS: 5.0000 mg | ORAL_TABLET | Freq: Once | ORAL | Status: DC | PRN

## 2019-07-25 MED ORDER — FENTANYL CITRATE (PF) 100 MCG/2ML IJ SOLN: 25.0000 ug | INTRAMUSCULAR | Status: DC | PRN

## 2019-07-25 MED ORDER — DEXAMETHASONE SODIUM PHOSPHATE 10 MG/ML IJ SOLN
INTRAMUSCULAR | Status: AC
  Filled 2019-07-25: qty 1

## 2019-07-25 MED ORDER — CEFAZOLIN SODIUM 1 G IJ SOLR
INTRAMUSCULAR | Status: AC
  Filled 2019-07-25: qty 20

## 2019-07-25 MED ORDER — MIDAZOLAM HCL 2 MG/2ML IJ SOLN
INTRAMUSCULAR | Status: AC
  Filled 2019-07-25: qty 2

## 2019-07-25 MED ORDER — OXYCODONE HCL 5 MG/5ML PO SOLN: 5.0000 mg | Freq: Once | ORAL | Status: DC | PRN

## 2019-07-25 MED ORDER — ONDANSETRON HCL 4 MG/2ML IJ SOLN
INTRAMUSCULAR | Status: AC
  Filled 2019-07-25: qty 2

## 2019-07-25 MED ORDER — PROMETHAZINE HCL 25 MG/ML IJ SOLN: 6.2500 mg | INTRAMUSCULAR | Status: DC | PRN

## 2019-07-25 MED ORDER — MIDAZOLAM HCL 2 MG/2ML IJ SOLN
1.0000 mg | INTRAMUSCULAR | Status: DC | PRN
  Administered 2019-07-25: 2 mg via INTRAVENOUS

## 2019-07-25 MED ORDER — ARTIFICIAL TEARS OPHTHALMIC OINT
TOPICAL_OINTMENT | OPHTHALMIC | Status: AC
  Filled 2019-07-25: qty 21

## 2019-07-25 MED ORDER — PROPOFOL 500 MG/50ML IV EMUL
INTRAVENOUS | Status: DC | PRN
  Administered 2019-07-25: 25 ug/kg/min via INTRAVENOUS

## 2019-07-25 MED ORDER — FENTANYL CITRATE (PF) 100 MCG/2ML IJ SOLN
50.0000 ug | INTRAMUSCULAR | Status: DC | PRN
  Administered 2019-07-25: 50 ug via INTRAVENOUS

## 2019-07-25 MED ORDER — LIDOCAINE HCL (PF) 0.5 % IJ SOLN
INTRAMUSCULAR | Status: DC | PRN
  Administered 2019-07-25: 35 mL via INTRAVENOUS

## 2019-07-25 MED ORDER — ONDANSETRON HCL 4 MG/2ML IJ SOLN
INTRAMUSCULAR | Status: DC | PRN
  Administered 2019-07-25: 4 mg via INTRAVENOUS

## 2019-07-25 MED ORDER — LIDOCAINE 2% (20 MG/ML) 5 ML SYRINGE
INTRAMUSCULAR | Status: AC
  Filled 2019-07-25: qty 5

## 2019-07-25 SURGICAL SUPPLY — 42 items
BLADE SURG 15 STRL LF DISP TIS (BLADE) ×2 IMPLANT
BLADE SURG 15 STRL SS (BLADE) ×4
BNDG ELASTIC 3X5.8 VLCR STR LF (GAUZE/BANDAGES/DRESSINGS) ×3 IMPLANT
BNDG ESMARK 4X9 LF (GAUZE/BANDAGES/DRESSINGS) ×3 IMPLANT
CORD BIPOLAR FORCEPS 12FT (ELECTRODE) ×3 IMPLANT
COVER BACK TABLE 60X90IN (DRAPES) ×3 IMPLANT
COVER MAYO STAND STRL (DRAPES) ×3 IMPLANT
COVER WAND RF STERILE (DRAPES) IMPLANT
CUFF TOURN SGL QUICK 18X4 (TOURNIQUET CUFF) ×3 IMPLANT
CUFF TOURN SGL QUICK 24 (TOURNIQUET CUFF)
CUFF TRNQT CYL 24X4X16.5-23 (TOURNIQUET CUFF) IMPLANT
DRAPE EXTREMITY T 121X128X90 (DISPOSABLE) ×3 IMPLANT
DRAPE SURG 17X23 STRL (DRAPES) ×3 IMPLANT
DURAPREP 26ML APPLICATOR (WOUND CARE) ×3 IMPLANT
GAUZE SPONGE 4X4 12PLY STRL (GAUZE/BANDAGES/DRESSINGS) ×3 IMPLANT
GAUZE XEROFORM 1X8 LF (GAUZE/BANDAGES/DRESSINGS) ×3 IMPLANT
GLOVE BIO SURGEON STRL SZ7.5 (GLOVE) ×6 IMPLANT
GLOVE BIOGEL PI IND STRL 6.5 (GLOVE) ×1 IMPLANT
GLOVE BIOGEL PI IND STRL 7.0 (GLOVE) ×1 IMPLANT
GLOVE BIOGEL PI IND STRL 8 (GLOVE) ×2 IMPLANT
GLOVE BIOGEL PI INDICATOR 6.5 (GLOVE) ×2
GLOVE BIOGEL PI INDICATOR 7.0 (GLOVE) ×2
GLOVE BIOGEL PI INDICATOR 8 (GLOVE) ×4
GLOVE ECLIPSE 6.5 STRL STRAW (GLOVE) ×3 IMPLANT
GOWN STRL REUS W/ TWL LRG LVL3 (GOWN DISPOSABLE) ×1 IMPLANT
GOWN STRL REUS W/TWL LRG LVL3 (GOWN DISPOSABLE) ×2
GOWN STRL REUS W/TWL XL LVL3 (GOWN DISPOSABLE) ×6 IMPLANT
LOOP VESSEL MAXI BLUE (MISCELLANEOUS) IMPLANT
NEEDLE HYPO 25X1 1.5 SAFETY (NEEDLE) ×3 IMPLANT
NS IRRIG 1000ML POUR BTL (IV SOLUTION) ×3 IMPLANT
PAD CAST 3X4 CTTN HI CHSV (CAST SUPPLIES) ×1 IMPLANT
PADDING CAST ABS 4INX4YD NS (CAST SUPPLIES)
PADDING CAST ABS COTTON 4X4 ST (CAST SUPPLIES) IMPLANT
PADDING CAST COTTON 3X4 STRL (CAST SUPPLIES) ×2
SET BASIN DAY SURGERY F.S. (CUSTOM PROCEDURE TRAY) ×3 IMPLANT
SLEEVE SCD COMPRESS KNEE MED (MISCELLANEOUS) IMPLANT
STOCKINETTE 4X48 STRL (DRAPES) ×3 IMPLANT
SUT ETHILON 3 0 PS 1 (SUTURE) ×3 IMPLANT
SYR BULB EAR ULCER 3OZ GRN STR (SYRINGE) ×3 IMPLANT
SYR CONTROL 10ML LL (SYRINGE) ×3 IMPLANT
TOWEL GREEN STERILE FF (TOWEL DISPOSABLE) ×3 IMPLANT
UNDERPAD 30X36 HEAVY ABSORB (UNDERPADS AND DIAPERS) ×3 IMPLANT

## 2019-07-25 NOTE — Anesthesia Preprocedure Evaluation (Signed)
Anesthesia Evaluation  Patient identified by MRN, date of birth, ID band Patient awake    Reviewed: Allergy & Precautions, NPO status , Patient's Chart, lab work & pertinent test results  Airway Mallampati: II  TM Distance: >3 FB Neck ROM: Full    Dental no notable dental hx. (+) Dental Advisory Given   Pulmonary neg pulmonary ROS,    Pulmonary exam normal breath sounds clear to auscultation       Cardiovascular negative cardio ROS Normal cardiovascular exam Rhythm:Regular Rate:Normal     Neuro/Psych negative neurological ROS  negative psych ROS   GI/Hepatic negative GI ROS, Neg liver ROS,   Endo/Other  negative endocrine ROS  Renal/GU negative Renal ROS     Musculoskeletal negative musculoskeletal ROS (+)   Abdominal   Peds  Hematology negative hematology ROS (+)   Anesthesia Other Findings   Reproductive/Obstetrics                             Anesthesia Physical Anesthesia Plan  ASA: II  Anesthesia Plan: Bier Block and Bier Block-LIDOCAINE ONLY   Post-op Pain Management:    Induction: Intravenous  PONV Risk Score and Plan: 1 and Ondansetron, Propofol infusion and Treatment may vary due to age or medical condition  Airway Management Planned: Natural Airway  Additional Equipment:   Intra-op Plan:   Post-operative Plan:   Informed Consent: I have reviewed the patients History and Physical, chart, labs and discussed the procedure including the risks, benefits and alternatives for the proposed anesthesia with the patient or authorized representative who has indicated his/her understanding and acceptance.     Dental advisory given  Plan Discussed with: CRNA  Anesthesia Plan Comments:         Anesthesia Quick Evaluation

## 2019-07-25 NOTE — Op Note (Signed)
NAME: Danny Schneider, Danny Schneider MEDICAL RECORD [id number] ACCOUNT [id number] DATE OF BIRTH:11-Mar-1971 FACILITY: [facility] LOCATION: [facility] PHYSICIAN:Ishitha Roper Aretha Parrot, MD  OPERATIVE REPORT  DATE OF PROCEDURE:  07/25/2019  PREOPERATIVE DIAGNOSIS:  Moderate to severe carpal tunnel syndrome, right upper extremity.  POSTOPERATIVE DIAGNOSIS:  Moderate to severe carpal tunnel syndrome, right upper extremity.  PROCEDURE:  Right open carpal tunnel release.  SURGEON:  Vanita Panda. Magnus Ivan, MD  ANESTHESIA: 1.  Right upper extremity Bier block. 2.  Local with 0.25% plain Marcaine.  ANTIBIOTICS:  Two g of IV Ancef.  ESTIMATED BLOOD LOSS:  Minimal.  TOURNIQUET TIME:  Under 20 minutes.  COMPLICATIONS:  None.  INDICATIONS:  The patient is a 49 year old with bilateral carpal tunnel syndrome.  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  Due to the significance of his carpal tunnel  syndrome and how this is detrimentally affecting his work, we did recommend a carpal tunnel release.  Through an interpreter, we explained what this involved and understood this completely.  We explained the risks and benefits of surgery and what to  expect intraoperative and postoperative.  DESCRIPTION OF PROCEDURE:  After informed consent was obtained and the appropriate right upper hip was marked, he was brought to the operating room and placed supine on the operating table with the right arm on an arm table.  A tourniquet was placed on  his forearm and a Bier block was obtained.  This was obtained of his right upper extremity.  His hand was then prepped and draped with DuraPrep and sterile drapes.  A timeout was called to identify correct patient and correct right hand.  I then made an  incision in the palm over the transverse carpal ligament.  I carried this proximally and distally and dissected down to the distal edge of  the transverse carpal ligament.  We were able to slowly and meticulously divide the transverse carpal ligament from  distal to proximal, all the while protecting the median nerve.  We were able to explore the median nerve and found it to be intact.  I then irrigated the tissue with normal saline solution.  We reapproximated the skin with interrupted 3-0 nylon suture.   Xeroform and well-padded sterile dressing was applied.  I did anesthetize the hand following surgery with 0.25% plain Marcaine as well.  The Bier block was let down.  His fingers pinkened nicely.  He was taken to the recovery room in stable condition.   All final counts were correct.  There were no complications noted.  Postoperatively, he will slowly increase his activities.  This will be done as an outpatient.  We will see him back in the office in 2 weeks.  We will give him instructions for wound  care as well.  VN/NUANCE  D:07/25/2019 T:07/25/2019 JOB:[id number]

## 2019-07-25 NOTE — Transfer of Care (Signed)
Immediate Anesthesia Transfer of Care Note  Patient: Danny Schneider  Procedure(s) Performed: RIGHT CARPAL TUNNEL RELEASE (Right Hand)  Patient Location: PACU  Anesthesia Type:MAC and Bier block  Level of Consciousness: drowsy and patient cooperative  Airway & Oxygen Therapy: Patient Spontanous Breathing and Patient connected to face mask oxygen  Post-op Assessment: Report given to RN and Post -op Vital signs reviewed and stable  Post vital signs: Reviewed and stable  Last Vitals:  Vitals Value Taken Time  BP 113/68 07/25/19 1217  Temp    Pulse 51 07/25/19 1219  Resp 22 07/25/19 1219  SpO2 100 % 07/25/19 1219  Vitals shown include unvalidated device data.  Last Pain:  Vitals:   07/25/19 0942  TempSrc: Tympanic  PainSc: 3          Complications: No apparent anesthesia complications

## 2019-07-25 NOTE — Brief Op Note (Signed)
07/25/2019  12:16 PM  PATIENT:  Danny Schneider  49 y.o. male  PRE-OPERATIVE DIAGNOSIS:  right carpal tunnel syndrome  POST-OPERATIVE DIAGNOSIS:  right carpal tunnel syndrome  PROCEDURE:  Procedure(s) with comments: RIGHT CARPAL TUNNEL RELEASE (Right) - Bier block  SURGEON:  Surgeon(s) and Role:    Kathryne Hitch, MD - Primary   ANESTHESIA:   local and regional  COUNTS:  YES  TOURNIQUET:   Total Tourniquet Time Documented: Forearm (Right) - 18 minutes Total: Forearm (Right) - 18 minutes   DICTATION: .Other Dictation: Dictation Number [id number]  PLAN OF CARE: Discharge to home after PACU  PATIENT DISPOSITION:  PACU - hemodynamically stable.   Delay start of Pharmacological VTE agent (>24hrs) due to surgical blood loss or risk of bleeding: no

## 2019-07-25 NOTE — Anesthesia Postprocedure Evaluation (Signed)
Anesthesia Post Note  Patient: Jackson  Procedure(s) Performed: RIGHT CARPAL TUNNEL RELEASE (Right Hand)     Patient location during evaluation: PACU Anesthesia Type: Bier Block and MAC Level of consciousness: awake and alert Pain management: pain level controlled Vital Signs Assessment: post-procedure vital signs reviewed and stable Respiratory status: spontaneous breathing, nonlabored ventilation and respiratory function stable Cardiovascular status: stable and blood pressure returned to baseline Postop Assessment: no apparent nausea or vomiting Anesthetic complications: no    Last Vitals:  Vitals:   07/25/19 1253 07/25/19 1309  BP:  138/82  Pulse: (!) 56 (!) 48  Resp: 18 16  Temp:  36.7 C  SpO2: 98% 96%    Last Pain:  Vitals:   07/25/19 1309  TempSrc: Oral  PainSc: 0-No pain                 Nimo Verastegui,W. EDMOND

## 2019-07-25 NOTE — Discharge Instructions (Signed)
You may increase your activities and use your right hand as comfort allows. Expect right hand swelling - ice and elevation as needed. Keep your dressing clean and dry. You may remove your dressing in 6 days and start getting your incision wet. In 6 days, do place daily band-aids over your incision to protect the sutures.   Post Anesthesia Home Care Instructions  Activity: Get plenty of rest for the remainder of the day. A responsible individual must stay with you for 24 hours following the procedure.  For the next 24 hours, DO NOT: -Drive a car -Advertising copywriter -Drink alcoholic beverages -Take any medication unless instructed by your physician -Make any legal decisions or sign important papers.  Meals: Start with liquid foods such as gelatin or soup. Progress to regular foods as tolerated. Avoid greasy, spicy, heavy foods. If nausea and/or vomiting occur, drink only clear liquids until the nausea and/or vomiting subsides. Call your physician if vomiting continues.  Special Instructions/Symptoms: Your throat may feel dry or sore from the anesthesia or the breathing tube placed in your throat during surgery. If this causes discomfort, gargle with warm salt water. The discomfort should disappear within 24 hours.  If you had a scopolamine patch placed behind your ear for the management of post- operative nausea and/or vomiting:  1. The medication in the patch is effective for 72 hours, after which it should be removed.  Wrap patch in a tissue and discard in the trash. Wash hands thoroughly with soap and water. 2. You may remove the patch earlier than 72 hours if you experience unpleasant side effects which may include dry mouth, dizziness or visual disturbances. 3. Avoid touching the patch. Wash your hands with soap and water after contact with the patch.

## 2019-07-25 NOTE — H&P (Signed)
Danny Schneider is an 49 y.o. male.   Chief Complaint: right hand numbness and tingling HPI:   49 yo male with kown right carpal tunnel syndrome that is moderate to severe confirmed with EMG/NCVS.  Presents for an open carpal tunnel tunnel release on the right today.  Failed conservative treatment.  Past Medical History:  Diagnosis Date  . Hypertriglyceridemia     Past Surgical History:  Procedure Laterality Date  . Right knee surgery      Family History  Problem Relation Age of Onset  . Diabetes Paternal Uncle    Social History:  reports that he has never smoked. He has never used smokeless tobacco. He reports previous alcohol use. He reports previous drug use. Drug: Marijuana.  Allergies: No Known Allergies  Medications Prior to Admission  Medication Sig Dispense Refill  . ibuprofen (ADVIL) 600 MG tablet Take 1 tablet (600 mg total) by mouth every 8 (eight) hours as needed. 60 tablet 1  . triamcinolone ointment (KENALOG) 0.1 % Apply 1 application topically 2 (two) times daily. 60 g 1    No results found for this or any previous visit (from the past 48 hour(s)). No results found.  Review of Systems  All other systems reviewed and are negative.   Blood pressure (!) 149/95, pulse 61, temperature (!) 97.5 F (36.4 C), temperature source Tympanic, resp. rate 20, height 5\' 3"  (1.6 m), weight 71 kg, SpO2 100 %. Physical Exam  Constitutional: He is oriented to person, place, and time. He appears well-developed and well-nourished.  HENT:  Head: Normocephalic and atraumatic.  Eyes: Pupils are equal, round, and reactive to light. EOM are normal.  Cardiovascular: Normal rate.  Respiratory: Effort normal.  GI: Soft.  Musculoskeletal:     Right hand: Decreased sensation of the median distribution.     Cervical back: Normal range of motion and neck supple.  Neurological: He is alert and oriented to person, place, and time.  Skin: Skin is warm and dry.      Assessment/Plan Right carpal tunnel syndrome  To the OR today as an outpatient for a right carpal tunnel release.  The risks and benefits of surgery have been discussed and informed consent is obtained.  , MD 07/25/2019, 11:39 AM

## 2019-07-26 ENCOUNTER — Encounter: Payer: Self-pay | Admitting: *Deleted

## 2019-08-07 ENCOUNTER — Other Ambulatory Visit: Payer: Self-pay | Admitting: Nurse Practitioner

## 2019-08-07 DIAGNOSIS — L309 Dermatitis, unspecified: Secondary | ICD-10-CM

## 2019-08-07 DIAGNOSIS — G8929 Other chronic pain: Secondary | ICD-10-CM

## 2019-08-07 DIAGNOSIS — G5603 Carpal tunnel syndrome, bilateral upper limbs: Secondary | ICD-10-CM

## 2019-08-07 DIAGNOSIS — M25561 Pain in right knee: Secondary | ICD-10-CM

## 2019-08-08 ENCOUNTER — Encounter: Payer: Self-pay | Admitting: Orthopaedic Surgery

## 2019-08-08 ENCOUNTER — Ambulatory Visit (INDEPENDENT_AMBULATORY_CARE_PROVIDER_SITE_OTHER): Payer: Self-pay | Admitting: Orthopaedic Surgery

## 2019-08-08 ENCOUNTER — Other Ambulatory Visit: Payer: Self-pay

## 2019-08-08 DIAGNOSIS — Z9889 Other specified postprocedural states: Secondary | ICD-10-CM

## 2019-08-08 MED FILL — IBUPROFEN 600 MG TABLET: 600 | 20 days supply | Qty: 60 | Fill #0

## 2019-08-08 MED FILL — TRIAMCINOLONE 0.1% OINTMENT: 0.1 | 7 days supply | Qty: 30 | Fill #0

## 2019-08-08 NOTE — Progress Notes (Signed)
The patient is a very pleasant 49 year old gentleman who is 2 weeks status post a right open carpal tunnel release.  He had severe carpal tunnel syndrome.  He still having residual numbness and tingling and I explained this to the interpreter that this is normal given the severity of his disease and it can take time to get recovery of the nerve.  His incision line looks good there is remove the sutures in place Steri-Strips.  His fingers and thumb are swollen.  There is no evidence of infection.  He does have carpal tunnel syndrome of the left side and will address that at a later date once we get him over the surgery from this right side.  I had a long and thorough discussion through the interpreter with the patient about what he should and should not do.  We talked about his expectations.  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  He cannot do that job as of yet.  I want him using his hand and work on getting his dexterity coordination back and it showed him things to try to do.  I would like to see him back in 4 weeks to see how he is doing in terms of recovery from his right side carpal tunnel release.  All questions and concerns were answered and addressed.

## 2019-08-13 ENCOUNTER — Encounter: Payer: Self-pay | Admitting: Nurse Practitioner

## 2019-08-13 ENCOUNTER — Ambulatory Visit: Payer: Self-pay | Attending: Nurse Practitioner | Admitting: Nurse Practitioner

## 2019-08-13 ENCOUNTER — Other Ambulatory Visit: Payer: Self-pay | Admitting: Nurse Practitioner

## 2019-08-13 ENCOUNTER — Other Ambulatory Visit: Payer: Self-pay

## 2019-08-13 VITALS — BP 158/92 | HR 67 | Temp 97.9°F | Ht 63.0 in | Wt 162.0 lb

## 2019-08-13 DIAGNOSIS — I10 Essential (primary) hypertension: Secondary | ICD-10-CM

## 2019-08-13 DIAGNOSIS — Z1211 Encounter for screening for malignant neoplasm of colon: Secondary | ICD-10-CM

## 2019-08-13 DIAGNOSIS — L209 Atopic dermatitis, unspecified: Secondary | ICD-10-CM

## 2019-08-13 MED ORDER — TRIAMCINOLONE ACETONIDE 0.1 % EX CREA: 1.0000 "application " | TOPICAL_CREAM | Freq: Two times a day (BID) | CUTANEOUS | 2 refills | Status: DC

## 2019-08-13 MED ORDER — AMLODIPINE BESYLATE 5 MG PO TABS: 5.0000 mg | ORAL_TABLET | Freq: Every day | ORAL | 3 refills | Status: DC

## 2019-08-13 NOTE — Progress Notes (Signed)
Assessment & Plan:  Danny Schneider was seen today for follow-up.  Diagnoses and all orders for this visit:  Essential hypertension -     amLODipine (NORVASC) 5 MG tablet; Take 1 tablet (5 mg total) by mouth daily. Continue all antihypertensives as prescribed.  Remember to bring in your blood pressure log with you for your follow up appointment.  DASH/Mediterranean Diets are healthier choices for HTN.    Atopic dermatitis, mild -     triamcinolone cream (KENALOG) 0.1 %; Apply 1 application topically 2 (two) times daily.  Colon cancer screening -     Fecal occult blood, imunochemical([facility])    Patient has been counseled on age-appropriate routine health concerns for screening and prevention. These are reviewed and up-to-date. Referrals have been placed accordingly. Immunizations are up-to-date or declined.    Subjective:   Chief Complaint  Patient presents with  . Follow-up    Pt. stated his skin rash is better, but it still there.    HPI Danny Schneider 49 y.o. male presents to office today for HTN.   Essential Hypertension Will start amlodipine as blood pressure not at goal. Denies chest pain, shortness of breath, palpitations, lightheadedness, dizziness, headaches or BLE edema. He does not monitor his blood pressure at home.  BP Readings from Last 3 Encounters:  08/13/19 (!) 158/92  07/25/19 138/82  11/20/18 135/86   Eczema: Patient complains of rash. Onset of symptoms several years ago, and have been gradually worsening since that time.  Risk factors include NONE. Treatment modalities that have been used in the past include: triamcinolone cream which has been effective.     Review of Systems  Constitutional: Negative for fever, malaise/fatigue and weight loss.  HENT: Negative.  Negative for nosebleeds.   Eyes: Negative.  Negative for blurred vision, double vision and photophobia.  Respiratory: Negative.  Negative for cough and shortness of breath.     Cardiovascular: Negative.  Negative for chest pain, palpitations and leg swelling.  Gastrointestinal: Positive for heartburn. Negative for nausea and vomiting.  Musculoskeletal: Negative.  Negative for myalgias.  Skin: Positive for itching and rash.  Neurological: Negative.  Negative for dizziness, focal weakness, seizures and headaches.  Psychiatric/Behavioral: Negative.  Negative for suicidal ideas.    Past Medical History:  Diagnosis Date  . Hypertriglyceridemia     Past Surgical History:  Procedure Laterality Date  . CARPAL TUNNEL RELEASE Right 07/25/2019   Procedure: RIGHT CARPAL TUNNEL RELEASE;  Surgeon: Danny Hitch, MD;  Location: [facility];  Service: Orthopedics;  Laterality: Right;  Bier block  . Right knee surgery      Family History  Problem Relation Age of Onset  . Diabetes Paternal Uncle     Social History Reviewed with no changes to be made today.   Outpatient Medications Prior to Visit  Medication Sig Dispense Refill  . ibuprofen (ADVIL) 600 MG tablet TAKE 1 TABLET (600 MG TOTAL) BY MOUTH EVERY 8 (EIGHT) HOURS AS NEEDED. 60 tablet 1  . triamcinolone ointment (KENALOG) 0.1 % APPLY 1 APPLICATION TOPICALLY 2 (TWO) TIMES DAILY. 30 g 1  . HYDROcodone-acetaminophen (NORCO/VICODIN) 5-325 MG tablet Take 1-2 tablets by mouth every 6 (six) hours as needed for moderate pain. (Patient not taking: Reported on 08/13/2019) 30 tablet 0   No facility-administered medications prior to visit.    No Known Allergies     Objective:    BP (!) 158/92 (BP Location: Left Arm, Patient Position: Sitting, Cuff Size: Normal)   Pulse  67   Temp 97.9 F (36.6 C) (Temporal)   Ht 5\' 3"  (1.6 m)   Wt 162 lb (73.5 kg)   SpO2 95%   BMI 28.70 kg/m  Wt Readings from Last 3 Encounters:  08/13/19 162 lb (73.5 kg)  07/25/19 156 lb 8.4 oz (71 kg)  11/20/18 153 lb (69.4 kg)    Physical Exam Vitals and nursing note reviewed.  Constitutional:      Appearance: He  is well-developed.  HENT:     Head: Normocephalic and atraumatic.  Cardiovascular:     Rate and Rhythm: Normal rate and regular rhythm.     Heart sounds: Normal heart sounds. No murmur. No friction rub. No gallop.   Pulmonary:     Effort: Pulmonary effort is normal. No tachypnea or respiratory distress.     Breath sounds: Normal breath sounds. No decreased breath sounds, wheezing, rhonchi or rales.  Chest:     Chest wall: No tenderness.  Abdominal:     General: Bowel sounds are normal.     Palpations: Abdomen is soft.  Musculoskeletal:        General: Normal range of motion.     Cervical back: Normal range of motion.  Skin:    General: Skin is warm and dry.     Findings: Rash present. Rash is macular.     Comments: Areas of eczema: left arm, left leg and back   Neurological:     Mental Status: He is alert and oriented to person, place, and time.     Coordination: Coordination normal.  Psychiatric:        Behavior: Behavior normal. Behavior is cooperative.        Thought Content: Thought content normal.        Judgment: Judgment normal.          Patient has been counseled extensively about nutrition and exercise as well as the importance of adherence with medications and regular follow-up. The patient was given clear instructions to go to ER or return to medical center if symptoms don't improve, worsen or new problems develop. The patient verbalized understanding.   Follow-up: Return in 3 weeks (on 09/03/2019), or if symptoms worsen or fail to improve, for BP recheck with Danny Schneider.   Danny Pounds, FNP-BC [facility] [place], [place]   08/13/2019, 10:05 PM

## 2019-08-13 NOTE — Patient Instructions (Signed)
CERAVE MOISTURIZING CREAM for your body

## 2019-08-14 MED FILL — TRIAMCINOLONE ACETONIDE 0.1: 0.1 | 15 days supply | Qty: 30 | Fill #0

## 2019-08-14 MED FILL — ?AMLODIPINE BESYLATE 5MG TA: 5 | 30 days supply | Qty: 30 | Fill #0

## 2019-08-15 LAB — FECAL OCCULT BLOOD, IMMUNOCHEMICAL: Fecal Occult Bld: NEGATIVE

## 2019-08-22 ENCOUNTER — Telehealth: Payer: Self-pay | Admitting: Orthopaedic Surgery

## 2019-08-22 NOTE — Telephone Encounter (Signed)
Pt states his job won't allow him to be out of work for much longer so he would like to go ahead and get scheduled for the left carpal tunnel surgery; he would like to be scheduled ASAP if possible.   [phone]

## 2019-09-10 ENCOUNTER — Ambulatory Visit (INDEPENDENT_AMBULATORY_CARE_PROVIDER_SITE_OTHER): Payer: Self-pay | Admitting: Orthopaedic Surgery

## 2019-09-10 ENCOUNTER — Other Ambulatory Visit: Payer: Self-pay

## 2019-09-10 ENCOUNTER — Encounter: Payer: Self-pay | Admitting: Orthopaedic Surgery

## 2019-09-10 DIAGNOSIS — Z9889 Other specified postprocedural states: Secondary | ICD-10-CM

## 2019-09-10 NOTE — Progress Notes (Signed)
The patient comes in today with continued follow-up status post a right open carpal tunnel release.  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  He is having appropriate hand pain.  His carpal tunnel EMG study showed severe carpal tunnel syndrome with both his upper extremities.  He wants to delay surgery on the left side while he gets back to work.  He does report some numbness and tingling and pain and weakness but it is improved since surgery.  Again it has only been about 7 weeks since his right open carpal tunnel release.  On examination his incision is healed.  He has got weak grip and pinch strength but it has improved from her last exam.  His numbness is minimal.  This point we will see him back in about 3 months to see how is doing overall.  He will continue to increase his activities as comfort allows.  We can proceed with left open carpal tunnel release when he would like to do so but he wants to hold off which is appropriate.

## 2019-09-11 ENCOUNTER — Ambulatory Visit: Payer: Self-pay | Admitting: Nurse Practitioner

## 2019-09-17 ENCOUNTER — Telehealth: Payer: Self-pay | Admitting: Orthopaedic Surgery

## 2019-09-17 NOTE — Telephone Encounter (Signed)
Pt states Dr.Blackman told him to let him know when he would like to proceed with his second surgery and the pt would like to start getting that scheduled now.   [phone]

## 2019-09-18 NOTE — Telephone Encounter (Signed)
This patient needs a left open carpal tunnel release at [facility].  CPT 64721  ICD-10 G56.02.

## 2019-09-19 ENCOUNTER — Telehealth: Payer: Self-pay | Admitting: Orthopaedic Surgery

## 2019-09-19 NOTE — Telephone Encounter (Signed)
Pts daughter called stating he was ready to get surgery; I let them speak with Sherrie and just wanted to make Dr.Blackman aware.

## 2019-09-24 NOTE — Telephone Encounter (Signed)
Spoke with patient in office to schedule surgery.

## 2019-10-01 ENCOUNTER — Telehealth: Payer: Self-pay | Admitting: Nurse Practitioner

## 2019-10-01 NOTE — Telephone Encounter (Signed)
She was advice to contact the place she purchase the money order, we don't have any idea how they work

## 2019-10-02 ENCOUNTER — Ambulatory Visit: Payer: Self-pay | Admitting: Physician Assistant

## 2019-10-09 ENCOUNTER — Other Ambulatory Visit: Payer: Self-pay | Admitting: Physician Assistant

## 2019-10-10 ENCOUNTER — Encounter (HOSPITAL_BASED_OUTPATIENT_CLINIC_OR_DEPARTMENT_OTHER): Payer: Self-pay | Admitting: Orthopaedic Surgery

## 2019-10-10 ENCOUNTER — Other Ambulatory Visit: Payer: Self-pay

## 2019-10-14 ENCOUNTER — Other Ambulatory Visit (HOSPITAL_COMMUNITY)
Admission: RE | Admit: 2019-10-14 | Discharge: 2019-10-14 | Disposition: A | Discharge status: Home / Self Care | Payer: HRSA Program | Source: Ambulatory Visit | Attending: Orthopaedic Surgery | Admitting: Orthopaedic Surgery

## 2019-10-14 DIAGNOSIS — Z01812 Encounter for preprocedural laboratory examination: Secondary | ICD-10-CM | POA: Diagnosis present

## 2019-10-14 DIAGNOSIS — Z20822 Contact with and (suspected) exposure to covid-19: Secondary | ICD-10-CM | POA: Insufficient documentation

## 2019-10-14 LAB — SARS CORONAVIRUS 2 (TAT 6-24 HRS): SARS Coronavirus 2: NEGATIVE

## 2019-10-14 NOTE — Progress Notes (Signed)
      Enhanced Recovery after Surgery for Orthopedics Enhanced Recovery after Surgery is a protocol used to improve the stress on your body and your recovery after surgery.  Patient Instructions  . The night before surgery:  o No food after midnight. ONLY clear liquids after midnight  . The day of surgery (if you do NOT have diabetes):  o Drink ONE (1) Pre-Surgery Clear Ensure by 0400 before surgery. o This drink was given to you during your hospital  pre-op appointment visit. o The pre-op nurse will instruct you on the time to drink the  Pre-Surgery Ensure depending on your surgery time. o Finish the drink at the designated time by the pre-op nurse.  o Nothing else to drink after completing the  Pre-Surgery Clear Ensure.  . The day of surgery (if you have diabetes): o Drink ONE (1) Gatorade 2 (G2) as directed. o This drink was given to you during your hospital  pre-op appointment visit.  o The pre-op nurse will instruct you on the time to drink the   Gatorade 2 (G2) depending on your surgery time. o Color of the Gatorade may vary. Red is not allowed. o Nothing else to drink after completing the  Gatorade 2 (G2).         If you have questions, please contact your surgeon's office.

## 2019-10-17 ENCOUNTER — Ambulatory Visit (HOSPITAL_BASED_OUTPATIENT_CLINIC_OR_DEPARTMENT_OTHER): Payer: Self-pay | Admitting: Anesthesiology

## 2019-10-17 ENCOUNTER — Encounter (HOSPITAL_BASED_OUTPATIENT_CLINIC_OR_DEPARTMENT_OTHER)
Admission: RE | Disposition: A | Discharge status: Home / Self Care | Payer: Self-pay | Source: Home / Self Care | Attending: Orthopaedic Surgery

## 2019-10-17 ENCOUNTER — Encounter (HOSPITAL_BASED_OUTPATIENT_CLINIC_OR_DEPARTMENT_OTHER): Payer: Self-pay | Admitting: Orthopaedic Surgery

## 2019-10-17 ENCOUNTER — Ambulatory Visit (HOSPITAL_BASED_OUTPATIENT_CLINIC_OR_DEPARTMENT_OTHER)
Admission: RE | Admit: 2019-10-17 | Discharge: 2019-10-17 | Disposition: A | Discharge status: Home / Self Care | Payer: Self-pay | Attending: Orthopaedic Surgery | Admitting: Orthopaedic Surgery

## 2019-10-17 DIAGNOSIS — I10 Essential (primary) hypertension: Secondary | ICD-10-CM | POA: Insufficient documentation

## 2019-10-17 DIAGNOSIS — Z79899 Other long term (current) drug therapy: Secondary | ICD-10-CM | POA: Insufficient documentation

## 2019-10-17 DIAGNOSIS — G5602 Carpal tunnel syndrome, left upper limb: Secondary | ICD-10-CM

## 2019-10-17 HISTORY — PX: CARPAL TUNNEL RELEASE: SHX101

## 2019-10-17 SURGERY — CARPAL TUNNEL RELEASE
Anesthesia: Regional | Site: Hand | Laterality: Left

## 2019-10-17 MED ORDER — LIDOCAINE-EPINEPHRINE 1 %-1:100000 IJ SOLN
INTRAMUSCULAR | Status: AC
  Filled 2019-10-17: qty 1

## 2019-10-17 MED ORDER — FENTANYL CITRATE (PF) 100 MCG/2ML IJ SOLN: 25.0000 ug | INTRAMUSCULAR | Status: DC | PRN

## 2019-10-17 MED ORDER — LIDOCAINE HCL (PF) 0.5 % IJ SOLN
INTRAMUSCULAR | Status: AC
  Filled 2019-10-17: qty 100

## 2019-10-17 MED ORDER — FENTANYL CITRATE (PF) 100 MCG/2ML IJ SOLN
INTRAMUSCULAR | Status: AC
  Filled 2019-10-17: qty 2

## 2019-10-17 MED ORDER — MIDAZOLAM HCL 2 MG/2ML IJ SOLN
INTRAMUSCULAR | Status: AC
  Filled 2019-10-17: qty 2

## 2019-10-17 MED ORDER — OXYCODONE HCL 5 MG PO TABS
ORAL_TABLET | ORAL | Status: AC
  Filled 2019-10-17: qty 1

## 2019-10-17 MED ORDER — BUPIVACAINE HCL (PF) 0.25 % IJ SOLN
INTRAMUSCULAR | Status: AC
  Filled 2019-10-17: qty 30

## 2019-10-17 MED ORDER — FENTANYL CITRATE (PF) 100 MCG/2ML IJ SOLN
INTRAMUSCULAR | Status: DC | PRN
  Administered 2019-10-17 (×2): 50 ug via INTRAVENOUS

## 2019-10-17 MED ORDER — OXYMETAZOLINE HCL 0.05 % NA SOLN
NASAL | Status: AC
  Filled 2019-10-17: qty 30

## 2019-10-17 MED ORDER — BUPIVACAINE HCL (PF) 0.5 % IJ SOLN
INTRAMUSCULAR | Status: AC
  Filled 2019-10-17: qty 30

## 2019-10-17 MED ORDER — PROPOFOL 10 MG/ML IV BOLUS
INTRAVENOUS | Status: DC | PRN
Start: 2019-10-17 — End: 2019-10-17
  Administered 2019-10-17: 10 mg via INTRAVENOUS
  Administered 2019-10-17: 20 mg via INTRAVENOUS

## 2019-10-17 MED ORDER — ACETAMINOPHEN 500 MG PO TABS
1000.0000 mg | ORAL_TABLET | Freq: Once | ORAL | Status: AC
  Administered 2019-10-17: 1000 mg via ORAL

## 2019-10-17 MED ORDER — LIDOCAINE 2% (20 MG/ML) 5 ML SYRINGE
INTRAMUSCULAR | Status: AC
  Filled 2019-10-17: qty 5

## 2019-10-17 MED ORDER — LIDOCAINE HCL (PF) 1 % IJ SOLN
INTRAMUSCULAR | Status: AC
  Filled 2019-10-17: qty 30

## 2019-10-17 MED ORDER — BACITRACIN ZINC 500 UNIT/GM EX OINT
TOPICAL_OINTMENT | CUTANEOUS | Status: AC
  Filled 2019-10-17: qty 0.9

## 2019-10-17 MED ORDER — BACITRACIN ZINC 500 UNIT/GM EX OINT
TOPICAL_OINTMENT | CUTANEOUS | Status: AC
  Filled 2019-10-17: qty 28.35

## 2019-10-17 MED ORDER — PROMETHAZINE HCL 25 MG PO TABS
12.5000 mg | ORAL_TABLET | Freq: Four times a day (QID) | ORAL | Status: AC | PRN
  Administered 2019-10-17: 12.5 mg via ORAL

## 2019-10-17 MED ORDER — OXYCODONE HCL 5 MG/5ML PO SOLN: 5.0000 mg | Freq: Once | ORAL | Status: AC | PRN

## 2019-10-17 MED ORDER — CEFAZOLIN SODIUM-DEXTROSE 2-4 GM/100ML-% IV SOLN
2.0000 g | INTRAVENOUS | Status: AC
  Administered 2019-10-17: 2 g via INTRAVENOUS

## 2019-10-17 MED ORDER — OXYCODONE HCL 5 MG PO TABS
5.0000 mg | ORAL_TABLET | Freq: Once | ORAL | Status: AC | PRN
  Administered 2019-10-17: 5 mg via ORAL

## 2019-10-17 MED ORDER — LACTATED RINGERS IV SOLN: INTRAVENOUS | Status: DC

## 2019-10-17 MED ORDER — CEFAZOLIN SODIUM-DEXTROSE 2-4 GM/100ML-% IV SOLN
INTRAVENOUS | Status: AC
  Filled 2019-10-17: qty 100

## 2019-10-17 MED ORDER — PROPOFOL 500 MG/50ML IV EMUL
INTRAVENOUS | Status: AC
  Filled 2019-10-17: qty 50

## 2019-10-17 MED ORDER — PROMETHAZINE HCL 25 MG/ML IJ SOLN: 6.2500 mg | INTRAMUSCULAR | Status: DC | PRN

## 2019-10-17 MED ORDER — HYDROCODONE-ACETAMINOPHEN 5-325 MG PO TABS: 1.0000 | ORAL_TABLET | Freq: Four times a day (QID) | ORAL | 0 refills | Status: AC | PRN

## 2019-10-17 MED ORDER — CIPROFLOXACIN-DEXAMETHASONE 0.3-0.1 % OT SUSP
OTIC | Status: AC
  Filled 2019-10-17: qty 7.5

## 2019-10-17 MED ORDER — ACETAMINOPHEN 500 MG PO TABS
ORAL_TABLET | ORAL | Status: AC
  Filled 2019-10-17: qty 2

## 2019-10-17 MED ORDER — MIDAZOLAM HCL 5 MG/5ML IJ SOLN
INTRAMUSCULAR | Status: DC | PRN
  Administered 2019-10-17: 2 mg via INTRAVENOUS

## 2019-10-17 SURGICAL SUPPLY — 41 items
BLADE SURG 15 STRL LF DISP TIS (BLADE) ×2 IMPLANT
BLADE SURG 15 STRL SS (BLADE) ×6
BNDG CMPR 9X4 STRL LF SNTH (GAUZE/BANDAGES/DRESSINGS)
BNDG ELASTIC 3X5.8 VLCR STR LF (GAUZE/BANDAGES/DRESSINGS) ×3 IMPLANT
BNDG ESMARK 4X9 LF (GAUZE/BANDAGES/DRESSINGS) IMPLANT
CORD BIPOLAR FORCEPS 12FT (ELECTRODE) ×3 IMPLANT
COVER BACK TABLE 60X90IN (DRAPES) ×3 IMPLANT
COVER MAYO STAND STRL (DRAPES) ×3 IMPLANT
COVER WAND RF STERILE (DRAPES) IMPLANT
CUFF TOURN SGL QUICK 18X4 (TOURNIQUET CUFF) ×3 IMPLANT
CUFF TOURN SGL QUICK 24 (TOURNIQUET CUFF)
CUFF TRNQT CYL 24X4X16.5-23 (TOURNIQUET CUFF) IMPLANT
DRAPE EXTREMITY T 121X128X90 (DISPOSABLE) ×3 IMPLANT
DRAPE SURG 17X23 STRL (DRAPES) ×3 IMPLANT
DURAPREP 26ML APPLICATOR (WOUND CARE) ×3 IMPLANT
GAUZE SPONGE 4X4 12PLY STRL (GAUZE/BANDAGES/DRESSINGS) ×3 IMPLANT
GAUZE XEROFORM 1X8 LF (GAUZE/BANDAGES/DRESSINGS) ×3 IMPLANT
GLOVE BIO SURGEON STRL SZ7.5 (GLOVE) ×6 IMPLANT
GLOVE BIOGEL M 6.5 STRL (GLOVE) ×3 IMPLANT
GLOVE BIOGEL PI IND STRL 6.5 (GLOVE) ×1 IMPLANT
GLOVE BIOGEL PI IND STRL 8 (GLOVE) ×2 IMPLANT
GLOVE BIOGEL PI INDICATOR 6.5 (GLOVE) ×2
GLOVE BIOGEL PI INDICATOR 8 (GLOVE) ×4
GOWN STRL REUS W/ TWL LRG LVL3 (GOWN DISPOSABLE) ×1 IMPLANT
GOWN STRL REUS W/TWL LRG LVL3 (GOWN DISPOSABLE) ×3
GOWN STRL REUS W/TWL XL LVL3 (GOWN DISPOSABLE) ×6 IMPLANT
LOOP VESSEL MAXI BLUE (MISCELLANEOUS) IMPLANT
NEEDLE HYPO 25X1 1.5 SAFETY (NEEDLE) IMPLANT
NS IRRIG 1000ML POUR BTL (IV SOLUTION) ×3 IMPLANT
PACK BASIN DAY SURGERY FS (CUSTOM PROCEDURE TRAY) ×3 IMPLANT
PAD CAST 3X4 CTTN HI CHSV (CAST SUPPLIES) ×1 IMPLANT
PADDING CAST ABS 4INX4YD NS (CAST SUPPLIES) ×2
PADDING CAST ABS COTTON 4X4 ST (CAST SUPPLIES) ×1 IMPLANT
PADDING CAST COTTON 3X4 STRL (CAST SUPPLIES) ×3
SLEEVE SCD COMPRESS KNEE MED (MISCELLANEOUS) IMPLANT
STOCKINETTE 4X48 STRL (DRAPES) ×3 IMPLANT
SUT ETHILON 3 0 PS 1 (SUTURE) ×3 IMPLANT
SYR BULB EAR ULCER 3OZ GRN STR (SYRINGE) ×3 IMPLANT
SYR CONTROL 10ML LL (SYRINGE) IMPLANT
TOWEL GREEN STERILE FF (TOWEL DISPOSABLE) ×3 IMPLANT
UNDERPAD 30X36 HEAVY ABSORB (UNDERPADS AND DIAPERS) ×3 IMPLANT

## 2019-10-17 NOTE — H&P (Signed)
Danny Schneider is an 49 y.o. male.   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  He has had a successful right carpal tunnel release.  He now presents for a left open carpal tunnel release.  This has been recommended given his physical exam findings and nerve study findings.  Past Medical History:  Diagnosis Date  . Hypertriglyceridemia     Past Surgical History:  Procedure Laterality Date  . CARPAL TUNNEL RELEASE Right 07/25/2019   Procedure: RIGHT CARPAL TUNNEL RELEASE;  Surgeon: Kathryne Hitch, MD;  Location: [facility];  Service: Orthopedics;  Laterality: Right;  Bier block  . Right knee surgery      Family History  Problem Relation Age of Onset  . Diabetes Paternal Uncle    Social History:  reports that he has never smoked. He has never used smokeless tobacco. He reports previous alcohol use. He reports previous drug use. Drug: Marijuana.  Allergies: No Known Allergies  Medications Prior to Admission  Medication Sig Dispense Refill  . amLODipine (NORVASC) 5 MG tablet Take 1 tablet (5 mg total) by mouth daily. 90 tablet 3  . ibuprofen (ADVIL) 600 MG tablet TAKE 1 TABLET (600 MG TOTAL) BY MOUTH EVERY 8 (EIGHT) HOURS AS NEEDED. 60 tablet 1  . triamcinolone cream (KENALOG) 0.1 % Apply 1 application topically 2 (two) times daily. 100 g 2    No results found for this or any previous visit (from the past 48 hour(s)). No results found.  Review of Systems  All other systems reviewed and are negative.   Height 5\' 3"  (1.6 m), weight 74.8 kg. Physical Exam Vitals reviewed.  Constitutional:      Appearance: Normal appearance.  HENT:     Head: Normocephalic and atraumatic.  Eyes:     Extraocular Movements: Extraocular movements intact.     Pupils: Pupils are equal, round, and  reactive to light.  Cardiovascular:     Rate and Rhythm: Normal rate and regular rhythm.     Pulses: Normal pulses.  Pulmonary:     Effort: Pulmonary effort is normal.     Breath sounds: Normal breath sounds.  Abdominal:     General: Abdomen is flat.     Palpations: Abdomen is soft.  Musculoskeletal:     Left hand: Decreased sensation of the median distribution.     Cervical back: Normal range of motion.  Neurological:     Mental Status: He is alert and oriented to person, place, and time.  Psychiatric:        Behavior: Behavior normal.      Assessment/Plan Left moderate to severe carpal tunnel syndrome  The plan is to proceed to surgery today for a left open carpal tunnel release.  Having had this before on the right side he is fully aware of the risk and benefits of surgery.  , MD 10/17/2019, 6:46 AM

## 2019-10-17 NOTE — Anesthesia Postprocedure Evaluation (Signed)
Anesthesia Post Note  Patient: Danny Schneider  Procedure(s) Performed: LEFT CARPAL TUNNEL RELEASE (Left Hand)     Patient location during evaluation: PACU Anesthesia Type: Bier Block Level of consciousness: awake and alert and oriented Pain management: pain level controlled Vital Signs Assessment: post-procedure vital signs reviewed and stable Respiratory status: spontaneous breathing, nonlabored ventilation and respiratory function stable Cardiovascular status: blood pressure returned to baseline Postop Assessment: no apparent nausea or vomiting Anesthetic complications: no   No complications documented.  Last Vitals:  Vitals:   10/17/19 0800 10/17/19 0815  BP: (!) 118/88 (!) 139/94  Pulse: 51 52  Resp: 13 16  Temp:    SpO2: 100% 98%    Last Pain:  Vitals:   10/17/19 0815  TempSrc:   PainSc: 4                  Kaylyn Layer

## 2019-10-17 NOTE — Anesthesia Procedure Notes (Signed)
Anesthesia Regional Block: Bier block (IV Regional)   Pre-Anesthetic Checklist: ,, timeout performed, Correct Patient, Correct Site, Correct Laterality, Correct Procedure,, site marked, surgical consent,, at surgeon's request  Laterality: Left  Prep: chloraprep       Needles:  Injection technique: Single-shot  Needle Type: Other      Needle Gauge: 20     Additional Needles:   Procedures:,,,,, intact distal pulses, Esmarch exsanguination, single tourniquet utilized,  Narrative:  Start time: 10/17/2019 7:30 AM End time: 10/17/2019 7:31 AM Injection made incrementally with aspirations every 33 mL.  Performed by: Personally

## 2019-10-17 NOTE — Anesthesia Preprocedure Evaluation (Addendum)
Anesthesia Evaluation  Patient identified by MRN, date of birth, ID band Patient awake    Reviewed: Allergy & Precautions, NPO status , Patient's Chart, lab work & pertinent test results  History of Anesthesia Complications Negative for: history of anesthetic complications  Airway Mallampati: II  TM Distance: >3 FB Neck ROM: Full    Dental no notable dental hx.    Pulmonary neg pulmonary ROS,    Pulmonary exam normal        Cardiovascular hypertension, Pt. on medications Normal cardiovascular exam     Neuro/Psych negative neurological ROS  negative psych ROS   GI/Hepatic negative GI ROS, Neg liver ROS,   Endo/Other  negative endocrine ROS  Renal/GU negative Renal ROS  negative genitourinary   Musculoskeletal left carpal tunnel syndrome   Abdominal   Peds  Hematology negative hematology ROS (+)   Anesthesia Other Findings Day of surgery medications reviewed with patient.  Reproductive/Obstetrics negative OB ROS                            Anesthesia Physical Anesthesia Plan  ASA: II  Anesthesia Plan: Bier Block and Bier Block-LIDOCAINE ONLY   Post-op Pain Management:    Induction:   PONV Risk Score and Plan: 1 and Propofol infusion, Treatment may vary due to age or medical condition and Ondansetron  Airway Management Planned: Natural Airway and Simple Face Mask  Additional Equipment: None  Intra-op Plan:   Post-operative Plan:   Informed Consent: I have reviewed the patients History and Physical, chart, labs and discussed the procedure including the risks, benefits and alternatives for the proposed anesthesia with the patient or authorized representative who has indicated his/her understanding and acceptance.       Plan Discussed with: CRNA  Anesthesia Plan Comments:        Anesthesia Quick Evaluation

## 2019-10-17 NOTE — Transfer of Care (Signed)
Immediate Anesthesia Transfer of Care Note  Patient: Danny Schneider  Procedure(s) Performed: LEFT CARPAL TUNNEL RELEASE (Left Hand)  Patient Location: PACU  Anesthesia Type:MAC and Bier block  Level of Consciousness: awake  Airway & Oxygen Therapy: Patient Spontanous Breathing and Patient connected to face mask oxygen  Post-op Assessment: Report given to RN and Post -op Vital signs reviewed and stable  Post vital signs: Reviewed and stable  Last Vitals:  Vitals Value Taken Time  BP 121/92 10/17/19 0753  Temp    Pulse 54 10/17/19 0754  Resp 12 10/17/19 0754  SpO2 100 % 10/17/19 0754  Vitals shown include unvalidated device data.  Last Pain:  Vitals:   10/17/19 0647  TempSrc: Oral  PainSc: 0-No pain         Complications: No complications documented.

## 2019-10-17 NOTE — Addendum Note (Signed)
Addendum  created 10/17/19 4103 by Caren Macadam, CRNA   Charge Capture section accepted

## 2019-10-17 NOTE — Discharge Instructions (Signed)
Liberacin endoscpica del tnel carpiano, cuidados posteriores Endoscopic Carpal Tunnel Release, Care After Esta hoja le brinda informacin sobre cmo cuidarse despus del procedimiento. Su mdico tambin podr darle instrucciones ms especficas. Comunquese con el mdico si tiene problemas o preguntas. Qu puedo esperar despus del procedimiento? Despus del procedimiento, es comn [employer] siguientes sntomas:  Engineer, mining.  Hinchazn.  Rigidez. Siga estas instrucciones en su casa: Medicamentos  Tome los medicamentos de venta libre y los recetados solamente como se lo haya indicado el mdico.  Pregntele al mdico si el medicamento recetado le impide conducir o usar maquinaria pesada. Cuidado de la incisin   Use la venda de compresin envolvente en la mano hasta que vuelva a la consulta de seguimiento. No permita que la venda se moje. Cbralas con un envoltorio hermtico cuando tome un bao de inmersin o [place].  Despus de que le quiten la venda de compresin, siga las indicaciones del mdico acerca del cuidado de la incisin. Asegrese de hacer lo siguiente: ? Lvese las manos con agua y [place] antes y despus de cambiar cualquier otra venda (vendaje). Use desinfectante para manos si no dispone de [place] y [place]. ? Cambie el vendaje como se lo haya indicado el mdico. ? No retire los puntos (suturas), la goma para cerrar la piel o las tiras [place]. Es posible que estos cierres cutneos deban quedar puestos en la piel durante 2semanas o ms tiempo. Si los bordes de las tiras [address] empiezan a despegarse y Scientific laboratory technician, puede recortar los que estn sueltos. No retire las tiras [employer] por completo a menos que el mdico se lo indique.  Despus de que le saquen la venda de compresin, Haematologist de la incisin todos los [address]  [address] para descartar signos de infeccin. Est atento a los siguientes signos: ? Aumento del enrojecimiento, la hinchazn o Chief Technology Officer. ? Ms lquido [place]. ? Calor. ? Pus o mal olor.  No tome baos de inmersin, no nade ni use el jacuzzi hasta que el mdico lo autorice. Pregntele al mdico si puede ducharse. Control del dolor, la rigidez y la hinchazn   Si se lo indican, aplique hielo sobre la zona de la [place]. Para hacer esto: ? Si tiene una frula o un dispositivo ortopdico desmontables, quteselos como se lo haya indicado el mdico. ? Ponga el hielo en una bolsa plstica. ? Coloque una [employer] piel y la bolsa de hielo o entre la venda y Copy. ? Aplique el hielo durante , 2 a 3veces por da.  Mueva los dedos con frecuencia para reducir la rigidez y la hinchazn.  Cuando est sentado o acostado, levante (eleve) la mueca por encima del nivel del corazn. Si tiene una frula o un dispositivo ortopdico:   Use la frula o el dispositivo ortopdico como se lo haya indicado el mdico. Quteselo solamente como se lo haya indicado el mdico.  Afloje la frula o el dispositivo ortopdico si los dedos de las manos se le adormecen, siente hormigueos o se le enfran y se tornan de Research officer, trade union.  Mantenga la frula o el dispositivo ortopdico limpios.  Si la frula o el dispositivo ortopdico no son impermeables: ? No deje que se mojen. ? Cbralas con un envoltorio hermtico cuando tome un bao de inmersin o [place]. Actividad  Use la mano cuidadosamente. No realice ninguna actividad que le cause dolor. Debe poder realizar actividades livianas con la mano.  No levante ningn objeto con la mano afectada [employer]  el mdico lo autorice.  Evite empujar y Mudlogger.  Retome sus actividades normales segn lo indicado por el mdico. Pregntele al mdico qu actividades son seguras para usted.  Pregntele al mdico cundo es seguro volver a Science writer. Instrucciones generales  No consuma ningn producto que contenga nicotina o tabaco, como cigarrillos, cigarrillos electrnicos y tabaco de Theatre manager. Estos pueden  retrasar la cicatrizacin de la incisin despus de la ciruga. Si necesita ayuda para dejar de fumar, consulte al mdico.  Concurra a todas las visitas de seguimiento como se lo haya indicado el mdico. Esto es importante. Comunquese con un mdico si tiene:  Escalofros o fiebre.  Dolor que no se [employer].  Cambio de color en los dedos.  Hormigueo, debilidad o adormecimiento.  Aumento del enrojecimiento, la hinchazn o el dolor alrededor de la incisin.  Aumento del lquido o sangre proveniente de la incisin.  Una incisin que est caliente al tacto.  Pus o mal olor en Immunologist de la incisin. Resumen  Despus del procedimiento, es normal tener dolor, hinchazn y entumecimiento. Llame al mdico si tiene un dolor que no mejora con medicamentos.  Vuelva a ver al mdico para que le quite la venda de compresin. Despus de que se la saquen, Haematologist de la incisin [employer] para descartar signos de infeccin.  Si tiene un frula o un dispositivo ortopdico, selos como se lo haya indicado el mdico. Mueva los dedos con frecuencia y Dietitian la mano por encima del nivel del corazn cuando est en reposo.  Retome sus actividades normales segn lo indicado por el mdico. Pregntele al mdico qu actividades son seguras para usted. Debe poder realizar actividades livianas con la mano.  Comunquese con un mdico si tiene adormecimiento, debilidad o fiebre o cualquier signo de infeccin. Esta informacin no tiene Theme park manager el consejo del mdico. Asegrese de hacerle al mdico cualquier pregunta que tenga. Document Revised: 07/27/2018 Document Reviewed: 07/27/2018 Elsevier Patient Education  2020 [employer]. You may use her hand as comfort allows. Do expect hand swelling and numbness in your fingers. Ice and elevation intermittently as needed. Keep your dressings clean and dry for the next 6 to 7 days. After 6 to 7 days, you may remove your  dressings and get your incision wet in the shower daily. After the initial dressing is removed, you may place large Band-Aids over the incision daily.   No Tylenol before 1:00pm if needed   Post Anesthesia Home Care Instructions  Activity: Get plenty of rest for the remainder of the day. A responsible individual must stay with you for 24 hours following the procedure.  For the next 24 hours, DO NOT: -Drive a car -Advertising copywriter -Drink alcoholic beverages -Take any medication unless instructed by your physician -Make any legal decisions or sign important papers.  Meals: Start with liquid foods such as gelatin or soup. Progress to regular foods as tolerated. Avoid greasy, spicy, heavy foods. If nausea and/or vomiting occur, drink only clear liquids until the nausea and/or vomiting subsides. Call your physician if vomiting continues.  Special Instructions/Symptoms: Your throat may feel dry or sore from the anesthesia or the breathing tube placed in your throat during surgery. If this causes discomfort, gargle with warm salt water. The discomfort should disappear within 24 hours.  If you had a scopolamine patch placed behind your ear for the management of post- operative nausea and/or vomiting:  1. The medication in the patch is effective for 72  hours, after which it should be removed.  Wrap patch in a tissue and discard in the trash. Wash hands thoroughly with soap and water. 2. You may remove the patch earlier than 72 hours if you experience unpleasant side effects which may include dry mouth, dizziness or visual disturbances. 3. Avoid touching the patch. Wash your hands with soap and water after contact with the patch.

## 2019-10-17 NOTE — Op Note (Signed)
NAME: Danny Schneider, Danny Schneider MEDICAL RECORD ER:[id number] ACCOUNT [id number] DATE OF BIRTH:Feb 16, 1971 FACILITY: [facility] LOCATION: [facility] PHYSICIAN:Jeilani Grupe Aretha Parrot, MD  OPERATIVE REPORT  DATE OF PROCEDURE:  10/17/2019  PREOPERATIVE DIAGNOSIS:  Left carpal tunnel syndrome.  POSTOPERATIVE DIAGNOSIS:  Left carpal tunnel syndrome.  PROCEDURE:  Left open carpal tunnel release.  SURGEON:  Vanita Panda. Magnus Ivan, MD  ANESTHESIA:  Left upper extremity Bier block.  ANTIBIOTICS:  2 grams IV Ancef.  TOURNIQUET TIME:  20 minutes.  ESTIMATED BLOOD LOSS:  Minimal.  COMPLICATIONS:  None.  INDICATIONS:  Danny Schneider is a 49 year old gentleman with bilateral carpal tunnel syndrome.  We actually were able to perform a right open carpal tunnel release earlier this year and he did well with that.  His carpal tunnel syndrome has been confirmed with  EMG and nerve conduction studies.  At this point, he has done so well with his right side that he wishes to go ahead and proceed with a left open carpal tunnel release.  Having had this before he is fully aware of the risk of nerve and vessel injury and  incomplete release of the transverse carpal ligament as well as continued hand weakness, numbness and tingling.  He understands the recovery process for the nerve given the severity of his carpal tunnel disease assessed with nerve conduction studies.  DESCRIPTION OF PROCEDURE:  After informed consent was obtained and appropriate left hand was marked he was brought to the operating room and placed supine on the operating table, left arm on an arm table.  A forearm tourniquet was placed and a Bier block  was obtained by anesthesia in his left upper extremity.  His left hand and wrist were then prepped and draped with DuraPrep and sterile drapes.  A time-out was called and he was identified as correct patient, correct left hand.  I then made an incision  over the transverse carpal ligament in the palm of  the hand and carried this slightly proximally and distally, dissected down to the distal edge of the transverse carpal ligament and slowly and meticulously divided it from distal to proximal completely  releasing the transverse carpal ligament.  I was able to assess the median nerve and found it and its motor branch to be intact.  We then irrigated the soft tissue with normal saline solution.  I reapproximated the skin with interrupted 3-0 nylon suture.   Xeroform well-padded sterile dressing was applied.  After 20 minutes of the tourniquet being up and due to the Bier block it was let down and his fingers pinkened nicely.  He is able to move his fingers and thumb.  He was taken to recovery room in  stable condition with all sponge counts being correct.  No complications noted.  Postoperatively, he will be discharged to home with slow increase in his activities.  Follow up will be in the office in 2 weeks.  CN/NUANCE  D:10/17/2019 T:10/17/2019 JOB:[id number]

## 2019-10-18 ENCOUNTER — Encounter (HOSPITAL_BASED_OUTPATIENT_CLINIC_OR_DEPARTMENT_OTHER): Payer: Self-pay | Admitting: Orthopaedic Surgery

## 2019-10-22 ENCOUNTER — Ambulatory Visit: Payer: Self-pay | Admitting: Orthopaedic Surgery

## 2019-10-31 ENCOUNTER — Encounter: Payer: Self-pay | Admitting: Physician Assistant

## 2019-10-31 ENCOUNTER — Ambulatory Visit (INDEPENDENT_AMBULATORY_CARE_PROVIDER_SITE_OTHER): Payer: Self-pay | Admitting: Physician Assistant

## 2019-10-31 DIAGNOSIS — Z9889 Other specified postprocedural states: Secondary | ICD-10-CM

## 2019-10-31 NOTE — Progress Notes (Signed)
HPI: Danny Schneider now 2 weeks status post left carpal tunnel release.  He is overall doing well states he has some tightness in the hand.  No longer having any numbness or tingling in the hand.  Noticed some bruising particularly between his fingers in the web spaces.  Physical exam: Left hand full motor subjectively sensation is intact.  Radial pulse 2+.  Surgical incisions well approximated with interrupted suture.  There is no signs of wound dehiscence or infection.  Sutures removed today.  Impression: Status post left carpal tunnel release 10/17/2019  Plan: Using the interpreter to communicate with patient today discussed with him the need for no heavy lifting of the left hand and no submerging the hand again water.  He is able to get the incision wet in the shower and wash the hand for hygiene purposes.  He will let the Steri-Strips that were placed today hold off with time.  Scar tissue mobilization encouraged.  We will keep him out of work until follow-up in 2 weeks and will reevaluate his work status at that point time.  Questions were encouraged and answered.

## 2019-11-11 ENCOUNTER — Ambulatory Visit: Payer: Self-pay | Attending: Family | Admitting: Family

## 2019-11-11 ENCOUNTER — Encounter: Payer: Self-pay | Admitting: Family

## 2019-11-11 ENCOUNTER — Other Ambulatory Visit: Payer: Self-pay | Admitting: Family

## 2019-11-11 ENCOUNTER — Other Ambulatory Visit: Payer: Self-pay

## 2019-11-11 VITALS — BP 147/92 | HR 71 | Temp 97.9°F | Resp 16 | Wt 163.4 lb

## 2019-11-11 DIAGNOSIS — Z789 Other specified health status: Secondary | ICD-10-CM

## 2019-11-11 DIAGNOSIS — L299 Pruritus, unspecified: Secondary | ICD-10-CM

## 2019-11-11 DIAGNOSIS — L209 Atopic dermatitis, unspecified: Secondary | ICD-10-CM

## 2019-11-11 MED ORDER — TRIAMCINOLONE ACETONIDE 0.1 % EX CREA: 1.0000 "application " | TOPICAL_CREAM | Freq: Two times a day (BID) | CUTANEOUS | 2 refills | Status: DC

## 2019-11-11 MED FILL — TRIAMCINOLONE ACETONIDE 0.1: 0.1 | 14 days supply | Qty: 90 | Fill #0

## 2019-11-11 NOTE — Patient Instructions (Addendum)
Contine con Triamcinolone y Cera Ve para el prurito. Haga un seguimiento con el proveedor de atencin primaria segn sea necesario.  Continue Triamcinolone and Cera Ve for pruritus. Follow-up with primary provider as needed.  Prurito Pruritus El prurito es una sensacin de picazn en la piel. Una de las causas ms frecuentes es la sequedad de la piel, pero muchas cosas diferentes pueden causar picazn. La mayora de las causas de picazn no requieren [place]. A veces, la picazn en la piel puede convertirse en una erupcin cutnea. Siga estas indicaciones en su casa: Cuidado de la piel   Aplquese una locin humectante sobre la piel segn sea necesario. Las lociones que contienen vaselina son las mejores.  Tome los medicamentos o aplquese las cremas con medicamento solamente como se lo haya indicado el mdico. Esto puede incluir lo siguiente: ? Crema con corticosteroides. ? Lociones para Associate Professor. ? Antihistamnicos por va oral.  Aplique un pao fro y hmedo (compresa fra) en la zona afectada.  Tome baos de inmersin con uno de los siguientes: ? Consulting civil engineer. Puede conseguirlas en la tienda de comestibles o la farmacia local. Siga las instrucciones del envase. ? Bicarbonato de sodio. Vierta un poco en la baera como se lo haya indicado el mdico. ? Avena coloidal. Puede conseguirla en la tienda de comestibles o la farmacia local. Siga las instrucciones del envase.  Aplquese una pasta de bicarbonato de [employer]. Para prepararla, mezcle un poco de agua con una pequea cantidad de bicarbonato de sodio hasta alcanzar la consistencia de una pasta.  No se rasque la piel.  No tome baos de inmersin ni duchas calientes, ya que pueden empeorar la picazn. Shaune Spittle de agua fra puede ayudar a Associate Professor, siempre y cuando use un humectante despus de la ducha.  No utilice jabones perfumados, detergentes, perfumes ni cosmticos. En lugar de eso,  utilice versiones suaves y sin perfume de estos productos. Instrucciones generales  Evite usar ropa ajustada.  Lleve un diario como ayuda para determinar la causa de la picazn. Escriba los siguientes datos: ? Lo que usted come y bebe. ? Los cosmticos que [place]. ? Qu detergentes o Cardell Peach. ? Lo que lleva puesto, incluidas las joyas.  Use un humidificador. Este [employer] humedad del aire, lo que ayuda a Chartered certified accountant.  Preste atencin a cualquier Advertising account planner. Comunquese con un mdico si:  La picazn no desaparece despus de [employer].  Tiene una sed inusual u orina ms de lo normal.  Siente hormigueo o adormecimiento en la piel.  Nota que la piel o la zona blanca de los ojos estn amarillas (ictericia).  Se siente dbil.  Tiene alguno de los siguientes sntomas: ? Sudoracin nocturna. ? Cansancio (fatiga). ? Prdida de peso. ? Dolor abdominal. Resumen  El prurito es una sensacin de picazn en la piel. Una de las causas ms frecuentes es la sequedad de la piel, pero muchas afecciones y factores diferentes pueden causar picazn.  Aplquese una locin humectante sobre la piel segn sea necesario. Las lociones que contienen vaselina son las mejores.  Tome los medicamentos o aplquese las cremas con medicamento solamente como se lo haya indicado el mdico.  No se duche ni tome baos de inmersin con agua caliente. No utilice jabones perfumados, detergentes, perfumes ni cosmticos. Esta informacin no tiene Theme park manager el consejo del mdico. Asegrese de hacerle al mdico cualquier pregunta que tenga. Document Revised: 05/17/2017 Document Reviewed:  05/17/2017 Elsevier Patient Education  2020 [employer].

## 2019-11-11 NOTE — Progress Notes (Signed)
Patient ID: Danny Schneider, male    DOB: 1970/07/06  MRN: [id number]  CC: Itchy  Subjective: Danny Schneider is a 49 y.o. male with history of carpal tunnel syndrome, pruritus of skin, and hypertriglyceridemia who presents for itching.   1. ITCHING FOLLOW-UP: Duration: chronic  Location: back  and arms  Itching: yes Burning: no Redness: no Oozing: no Scaling: no Blisters: no Painful: no Fevers: no History of same: yes Context: better Alleviating factors: hydrocortisone cream and Cera Ve Treatments attempted:hydrocortisone cream and Cera Ve Shortness of breath: no  Throat/tongue swelling: no Myalgias/arthralgias: no  Last visit 08/13/2019 with nurse practitioner Meredeth Ide. During that encounter patient seen for itching. Today reports rash is better with cream and requests refill.    Patient Active Problem List   Diagnosis Date Noted  . Carpal tunnel syndrome, left upper limb 10/17/2019  . Carpal tunnel syndrome, right upper limb 07/25/2019  . Hypertriglyceridemia 10/23/2018  . Pruritus of skin 09/14/2016     Current Outpatient Medications on File Prior to Visit  Medication Sig Dispense Refill  . amLODipine (NORVASC) 5 MG tablet Take 1 tablet (5 mg total) by mouth daily. 90 tablet 3  . HYDROcodone-acetaminophen (NORCO/VICODIN) 5-325 MG tablet Take 1-2 tablets by mouth every 6 (six) hours as needed for moderate pain. 30 tablet 0  . ibuprofen (ADVIL) 600 MG tablet TAKE 1 TABLET (600 MG TOTAL) BY MOUTH EVERY 8 (EIGHT) HOURS AS NEEDED. 60 tablet 1  . triamcinolone cream (KENALOG) 0.1 % Apply 1 application topically 2 (two) times daily. 100 g 2   No current facility-administered medications on file prior to visit.    No Known Allergies  Social History   Socioeconomic History  . Marital status: Married    Spouse name: Not on file  . Number of children: Not on file  . Years of education: Not on file  . Highest education level: Not on file  Occupational  History  . Not on file  Tobacco Use  . Smoking status: Never Smoker  . Smokeless tobacco: Never Used  Vaping Use  . Vaping Use: Never used  Substance and Sexual Activity  . Alcohol use: Not Currently  . Drug use: Not Currently    Types: Marijuana  . Sexual activity: Yes  Other Topics Concern  . Not on file  Social History Narrative  . Not on file   Social Determinants of Health   Financial Resource Strain:   . Difficulty of Paying Living Expenses: Not on file  Food Insecurity:   . Worried About Programme researcher, broadcasting/film/video in the Last Year: Not on file  . Ran Out of Food in the Last Year: Not on file  Transportation Needs:   . Lack of Transportation (Medical): Not on file  . Lack of Transportation (Non-Medical): Not on file  Physical Activity:   . Days of Exercise per Week: Not on file  . Minutes of Exercise per Session: Not on file  Stress:   . Feeling of Stress : Not on file  Social Connections:   . Frequency of Communication with Friends and Family: Not on file  . Frequency of Social Gatherings with Friends and Family: Not on file  . Attends Religious Services: Not on file  . Active Member of Clubs or Organizations: Not on file  . Attends Banker Meetings: Not on file  . Marital Status: Not on file  Intimate Partner Violence:   . Fear of Current or Ex-Partner: Not on  file  . Emotionally Abused: Not on file  . Physically Abused: Not on file  . Sexually Abused: Not on file    Family History  Problem Relation Age of Onset  . Diabetes Paternal Uncle     Past Surgical History:  Procedure Laterality Date  . CARPAL TUNNEL RELEASE Right 07/25/2019   Procedure: RIGHT CARPAL TUNNEL RELEASE;  Surgeon: Kathryne Hitch, MD;  Location: [facility];  Service: Orthopedics;  Laterality: Right;  Bier block  . CARPAL TUNNEL RELEASE Left 10/17/2019   Procedure: LEFT CARPAL TUNNEL RELEASE;  Surgeon: Kathryne Hitch, MD;  Location: [facility];  Service: Orthopedics;  Laterality: Left;  . Right knee surgery      ROS: Review of Systems Negative except as stated above  PHYSICAL EXAM: Vitals with BMI 11/11/2019 10/17/2019 10/17/2019  Height - - -  Weight 163 lbs 6 oz - -  BMI - - -  Systolic 147 138 540  Diastolic 92 84 94  Pulse 71 50 52   Wt Readings from Last 3 Encounters:  11/11/19 163 lb 6.4 oz (74.1 kg)  10/17/19 161 lb 6 oz (73.2 kg)  08/13/19 162 lb (73.5 kg)    Physical Exam General appearance - alert, well appearing, and in no distress and oriented to person, place, and time Mental status - alert, oriented to person, place, and time, normal mood, behavior, speech, dress, motor activity, and thought processes Lymphatics - no palpable lymphadenopathy, no hepatosplenomegaly Chest - clear to auscultation, no wheezes, rales or rhonchi, symmetric air entry, no tachypnea, retractions or cyanosis Heart - normal rate, regular rhythm, normal S1, S2, no murmurs, rubs, clicks or gallops Skin - DERMATITIS NOTED: bilateral upper arms and bilateral upper back  ASSESSMENT AND PLAN: 1. Atopic dermatitis, mild: 2. Pruritus of skin: - Continue Triamcinolone as prescribed.  - Continue over-the-counter Cera Ve.  - Avoid hot showers and baths. Instead use warm showers.  - Moisturize skin after bathing or showering.  - Blood pressure not at goal during today's visit. Patient asymptomatic without chest pressure, chest pain, palpitations, and shortness of breath. Reports he has not taken blood pressure medication today yet. - Follow-up with primary provider as needed.  - triamcinolone cream (KENALOG) 0.1 %; Apply 1 application topically 2 (two) times daily.  Dispense: 100 g; Refill: 2  3. Language barrier: - [employer] participated during this visit. Interpreter Name: Elita Quick ID#: [id number].   Patient was given the opportunity to ask questions.  Patient verbalized understanding of the plan and was able to repeat  key elements of the plan. Patient was given clear instructions to go to Emergency Department or return to medical center if symptoms don't improve, worsen, or new problems develop.The patient verbalized understanding.  Rema Fendt, NP

## 2019-11-12 ENCOUNTER — Ambulatory Visit (INDEPENDENT_AMBULATORY_CARE_PROVIDER_SITE_OTHER): Payer: Self-pay | Admitting: Orthopaedic Surgery

## 2019-11-12 ENCOUNTER — Encounter: Payer: Self-pay | Admitting: Orthopaedic Surgery

## 2019-11-12 DIAGNOSIS — Z9889 Other specified postprocedural states: Secondary | ICD-10-CM

## 2019-11-12 MED FILL — AMLODIPINE BESYLATE 5 MG TA: 5 | 30 days supply | Qty: 30 | Fill #1

## 2019-11-12 NOTE — Progress Notes (Signed)
The patient is now almost 4 weeks status post a left open carpal tunnel release and almost 4 months status post a right open carpal tunnel release.  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  He does report some pain in the palm from the incision site itself.  He still has some achiness in his fingers.  He has an interpreter with him today to explain all this.  On exam his incisions on both sides look good.  He has improved grip and pinch strength on the right side but certainly still weakness on the left side but it is only been 4 weeks since surgery on the left side.  At this point we will allow him to return to light duty work starting 30 August.  I did give him a note reflecting that if he is struggling at work on that day that he can stay out until 13 September.  All question concerns were answered addressed.  I would still like to see him back in 4 weeks just for repeat exam.

## 2019-12-09 MED FILL — TRIAMCINOLONE ACETONIDE 0.1: 0.1 | 14 days supply | Qty: 90 | Fill #1

## 2019-12-09 MED FILL — AMLODIPINE BESYLATE 5 MG TA: 5 | 30 days supply | Qty: 30 | Fill #2

## 2019-12-10 ENCOUNTER — Ambulatory Visit (INDEPENDENT_AMBULATORY_CARE_PROVIDER_SITE_OTHER): Payer: Self-pay | Admitting: Orthopaedic Surgery

## 2019-12-10 ENCOUNTER — Encounter: Payer: Self-pay | Admitting: Orthopaedic Surgery

## 2019-12-10 DIAGNOSIS — Z9889 Other specified postprocedural states: Secondary | ICD-10-CM

## 2019-12-10 NOTE — Progress Notes (Signed)
HPI: Danny Schneider returns today almost 2 months status post left carpal tunnel release.  He is overall doing well.  States he has no numbness tingling in the hand and occasional sharp pains around the incision.  Otherwise no complaints.   Physical exam: Left hand he has full motor full sensation.  Surgical incisions well-healed no signs of infection wound dehiscence.  Impression: Status post left carpal tunnel release 10/17/2018  Plan: He will continue to work on scar tissue mobilization.  Follow-up with [place] as needed.  Questions were encouraged and answered using an interpreter today.

## 2020-01-13 MED FILL — ?AMLODIPINE BESYLATE 5MG TA: 5 | 30 days supply | Qty: 30 | Fill #3

## 2020-01-14 MED FILL — TRIAMCINOLONE 0.1% CREAM: 0.1 | 14 days supply | Qty: 90 | Fill #2

## 2020-01-28 MED FILL — TRIAMCINOLONE 0.1% CREAM: 0.1 | 14 days supply | Qty: 90 | Fill #2

## 2020-03-17 MED FILL — AMLODIPINE BESYLATE 5 MG TA: 5 | 30 days supply | Qty: 30 | Fill #4

## 2020-04-29 MED FILL — AMLODIPINE BESYLATE 5 MG TA: 5 | 30 days supply | Qty: 30 | Fill #5

## 2020-05-18 MED FILL — TRIAMCINOLONE 0.1% CREAM: 0.1 | 4 days supply | Qty: 30 | Fill #3

## 2020-06-22 ENCOUNTER — Other Ambulatory Visit: Payer: Self-pay

## 2020-07-02 ENCOUNTER — Other Ambulatory Visit: Payer: Self-pay

## 2020-07-02 MED FILL — Amlodipine Besylate Tab 5 MG (Base Equivalent): ORAL | 30 days supply | Qty: 30 | Fill #0 | Status: AC

## 2020-07-23 ENCOUNTER — Other Ambulatory Visit: Payer: Self-pay

## 2020-07-23 ENCOUNTER — Other Ambulatory Visit: Payer: Self-pay | Admitting: Family

## 2020-07-23 DIAGNOSIS — L299 Pruritus, unspecified: Secondary | ICD-10-CM

## 2020-07-23 DIAGNOSIS — L209 Atopic dermatitis, unspecified: Secondary | ICD-10-CM

## 2020-07-24 ENCOUNTER — Other Ambulatory Visit: Payer: Self-pay

## 2020-07-30 ENCOUNTER — Other Ambulatory Visit: Payer: Self-pay

## 2020-07-30 MED ORDER — TRIAMCINOLONE ACETONIDE 0.1 % EX CREA
TOPICAL_CREAM | Freq: Two times a day (BID) | CUTANEOUS | 2 refills | Status: AC
  Filled 2020-07-30: qty 60, 30d supply, fill #0
  Filled 2020-08-11: qty 30, 15d supply, fill #0

## 2020-07-30 MED FILL — Amlodipine Besylate Tab 5 MG (Base Equivalent): ORAL | 30 days supply | Qty: 30 | Fill #1 | Status: AC

## 2020-07-31 ENCOUNTER — Other Ambulatory Visit: Payer: Self-pay

## 2020-08-06 ENCOUNTER — Other Ambulatory Visit: Payer: Self-pay

## 2020-08-11 ENCOUNTER — Other Ambulatory Visit: Payer: Self-pay

## 2020-09-03 ENCOUNTER — Other Ambulatory Visit: Payer: Self-pay | Admitting: Nurse Practitioner

## 2020-09-03 ENCOUNTER — Other Ambulatory Visit: Payer: Self-pay

## 2020-09-03 DIAGNOSIS — I10 Essential (primary) hypertension: Secondary | ICD-10-CM

## 2020-09-04 ENCOUNTER — Other Ambulatory Visit: Payer: Self-pay

## 2020-09-07 ENCOUNTER — Other Ambulatory Visit: Payer: Self-pay

## 2021-01-29 IMAGING — DX DG WRIST COMPLETE 3+V*R*
4 series · 4 of 4 positions shown · non-contrast
Comparison: No prior.

CLINICAL DATA: Chronic right hand pain.

EXAM:
RIGHT WRIST - COMPLETE 3+ VIEW

[wrist pa]
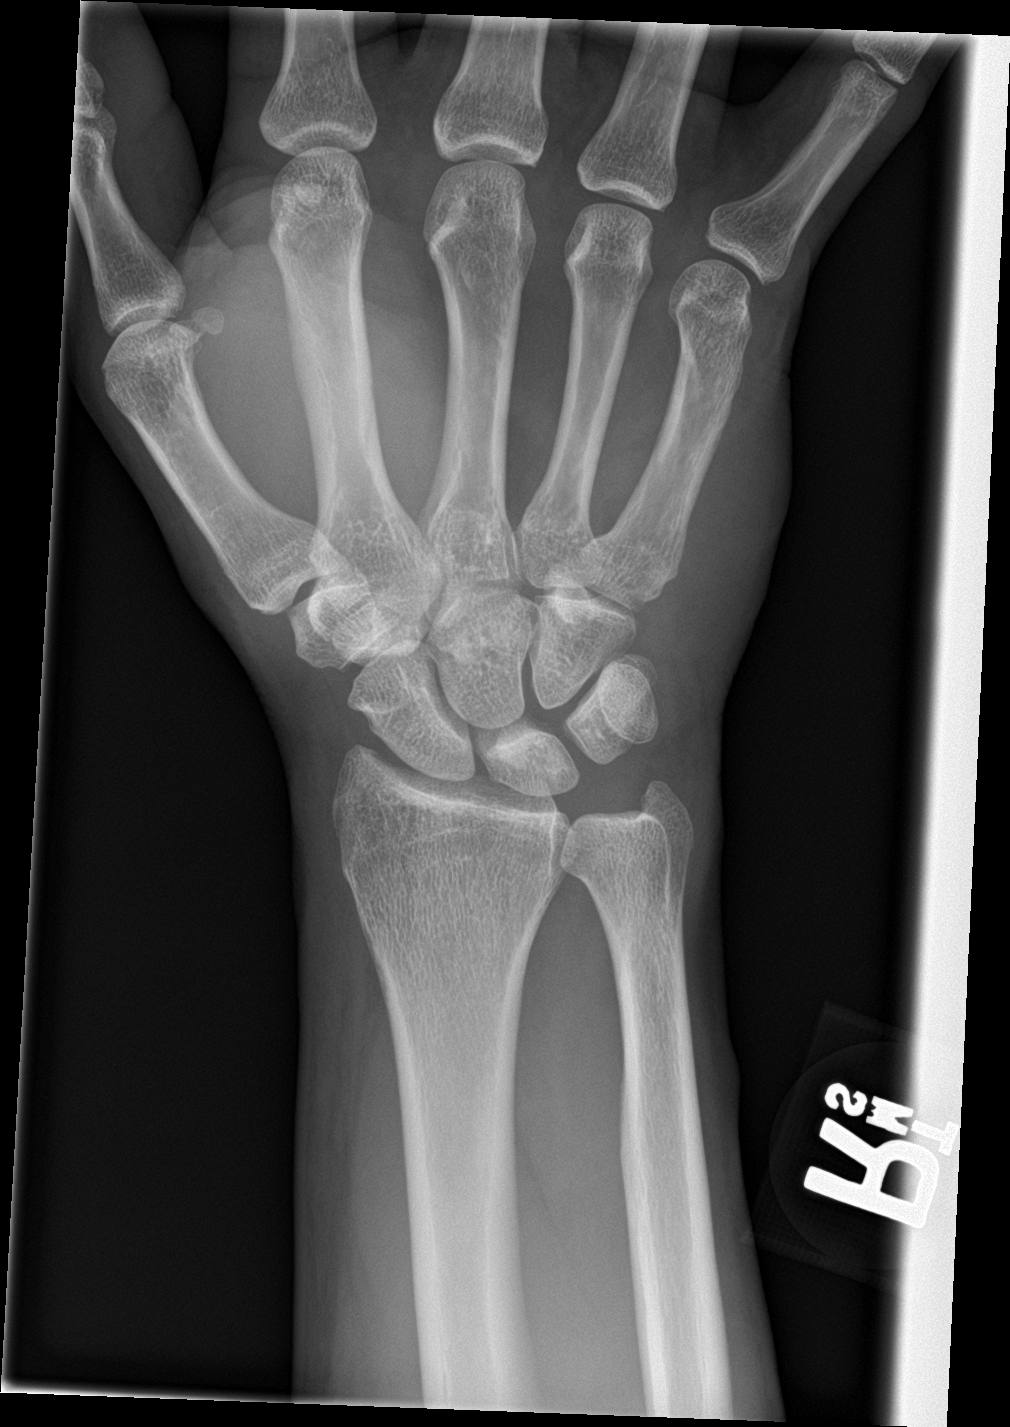

[wrist obl]
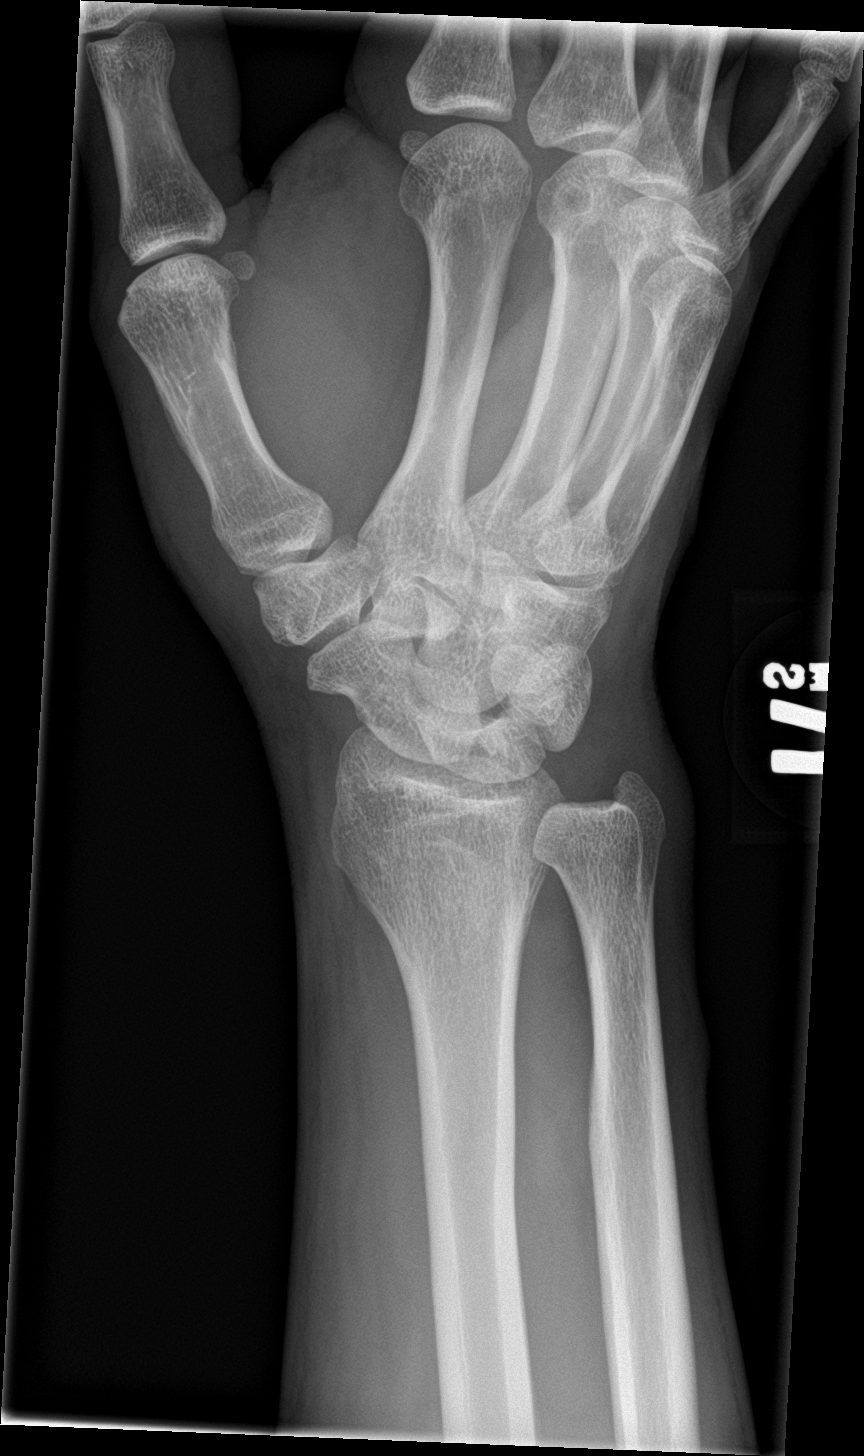

[wrist lat]
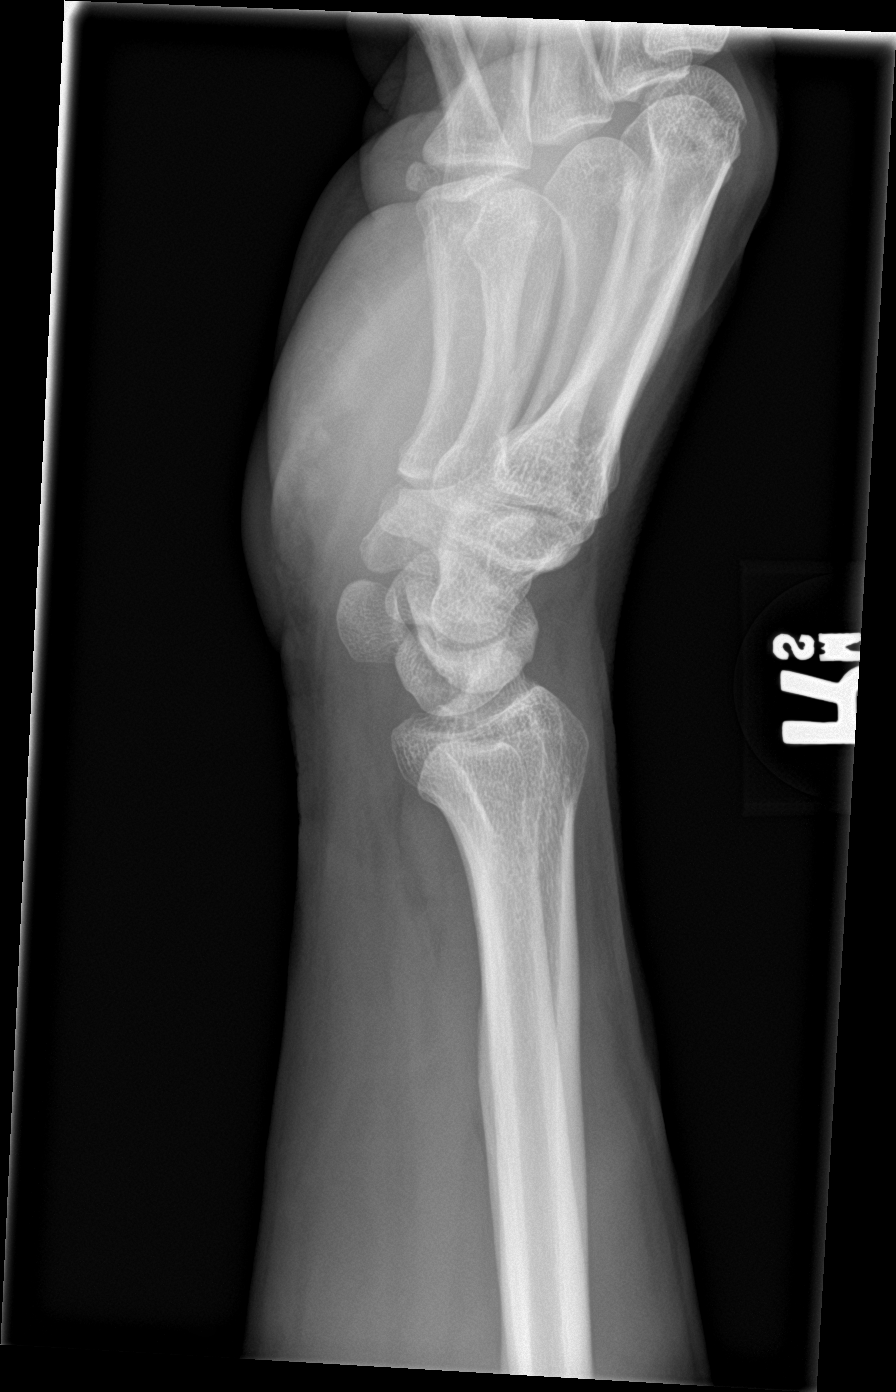

[wrist navicular]
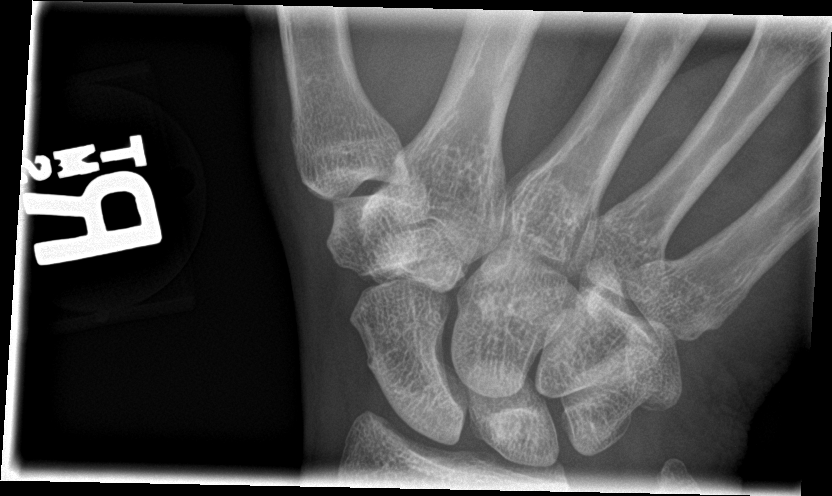

[4 of 4 positions shown; findings below may reference images not displayed]

FINDINGS: No acute bony or joint abnormality identified. No evidence of
fracture or dislocation. Mild sclerosis noted over the lunate.
Avascular necrosis cannot be excluded. Soft tissues are
unremarkable.
IMPRESSION: 1. Mild sclerosis of the lunate. A vascular necrosis of the lunate
cannot be excluded.

2.  No acute or focal bony abnormality otherwise noted.

## 2021-12-28 ENCOUNTER — Other Ambulatory Visit (HOSPITAL_COMMUNITY): Payer: Self-pay

## 2022-02-22 ENCOUNTER — Ambulatory Visit: Payer: Self-pay

## 2022-02-22 NOTE — Telephone Encounter (Signed)
The patient called in stating he has been having pain in his legs, feet and ribs. He states it is not due to injury and he has been dealing with it for awhile now. He states the pain is somewhere between a 4-6. You will need an interpreter when you call him. Please assist patient further    Used Spanish interpreter [place] # [phone]. Chief Complaint: Pain legs, feet, and ribs Symptoms: Above Frequency: 1 year ago Pertinent Negatives: Patient denies injury Disposition: [] ED /[] Urgent Care (no appt availability in office) / [] Appointment(In office/virtual)/ []  [facility]/ [] Home Care/ [] Refused Recommended Disposition /[] [facility]/ [x]  Follow-up with PCP Additional Notes: Pt. Asking to be worked in, please advise.  Answer Assessment - Initial Assessment Questions 1. ONSET: "When did the pain start?"      1 year ago 2. LOCATION: "Where is the pain located?"      Legs, feet, ribs 3. PAIN: "How bad is the pain?"    (Scale 1-10; or mild, moderate, severe)   -  MILD (1-3): doesn't interfere with normal activities    -  MODERATE (4-7): interferes with normal activities (e.g., work or school) or awakens from sleep, limping    -  SEVERE (8-10): excruciating pain, unable to do any normal activities, unable to walk     4-6 4. WORK OR EXERCISE: "Has there been any recent work or exercise that involved this part of the body?"      No 5. CAUSE: "What do you think is causing the leg pain?"     Unsure 6. OTHER SYMPTOMS: "Do you have any other symptoms?" (e.g., chest pain, back pain, breathing difficulty, swelling, rash, fever, numbness, weakness)     Ribs 7. PREGNANCY: "Is there any chance you are pregnant?" "When was your last menstrual period?"     No  Protocols used: Leg Pain-A-AH

## 2022-02-22 NOTE — Telephone Encounter (Addendum)
Scheduled 1st available new patient apt. Preferred Tuesdays.  Advised of Mobile Unit location on Wednesday and the hours.   Interpreter assistance provided by [place], [place] [id number], with [employer].

## 2022-04-26 ENCOUNTER — Other Ambulatory Visit: Payer: Self-pay

## 2022-04-26 ENCOUNTER — Encounter: Payer: Self-pay | Admitting: Nurse Practitioner

## 2022-04-26 ENCOUNTER — Ambulatory Visit (INDEPENDENT_AMBULATORY_CARE_PROVIDER_SITE_OTHER): Payer: Self-pay | Admitting: Primary Care

## 2022-04-26 ENCOUNTER — Ambulatory Visit: Payer: Self-pay | Attending: Primary Care | Admitting: Nurse Practitioner

## 2022-04-26 VITALS — BP 145/90 | HR 94 | Ht 63.0 in | Wt 155.6 lb

## 2022-04-26 DIAGNOSIS — E783 Hyperchylomicronemia: Secondary | ICD-10-CM

## 2022-04-26 DIAGNOSIS — M25561 Pain in right knee: Secondary | ICD-10-CM

## 2022-04-26 DIAGNOSIS — K089 Disorder of teeth and supporting structures, unspecified: Secondary | ICD-10-CM

## 2022-04-26 DIAGNOSIS — L84 Corns and callosities: Secondary | ICD-10-CM

## 2022-04-26 DIAGNOSIS — G8929 Other chronic pain: Secondary | ICD-10-CM

## 2022-04-26 DIAGNOSIS — M25562 Pain in left knee: Secondary | ICD-10-CM

## 2022-04-26 DIAGNOSIS — L209 Atopic dermatitis, unspecified: Secondary | ICD-10-CM

## 2022-04-26 DIAGNOSIS — Z130 Encounter for screening for diseases of the blood and blood-forming organs and certain disorders involving the immune mechanism: Secondary | ICD-10-CM

## 2022-04-26 DIAGNOSIS — Z1211 Encounter for screening for malignant neoplasm of colon: Secondary | ICD-10-CM

## 2022-04-26 DIAGNOSIS — I10 Essential (primary) hypertension: Secondary | ICD-10-CM

## 2022-04-26 MED ORDER — MELOXICAM 7.5 MG PO TABS
7.5000 mg | ORAL_TABLET | Freq: Every day | ORAL | 0 refills | Status: DC
  Filled 2022-04-26: qty 30, 30d supply, fill #0

## 2022-04-26 MED ORDER — TRIAMCINOLONE ACETONIDE 0.1 % EX OINT
1.0000 | TOPICAL_OINTMENT | Freq: Two times a day (BID) | CUTANEOUS | 0 refills | Status: DC
  Filled 2022-04-26: qty 30, 15d supply, fill #0

## 2022-04-26 MED ORDER — AMLODIPINE BESYLATE 5 MG PO TABS
ORAL_TABLET | Freq: Every day | ORAL | 3 refills | Status: DC
  Filled 2022-04-26: qty 90, 90d supply, fill #0
  Filled 2022-07-19: qty 90, 90d supply, fill #1

## 2022-04-26 NOTE — Progress Notes (Signed)
Pain on right side of abdomen.  Pain on knees and feet calluses.

## 2022-04-26 NOTE — Progress Notes (Unsigned)
Assessment & Plan:  Lovie was seen today for hypertension.  Diagnoses and all orders for this visit:  Essential hypertension -     amLODipine (NORVASC) 5 MG tablet; TAKE 1 TABLET (5 MG TOTAL) BY MOUTH DAILY. -     CMP14+EGFR Continue all antihypertensives as prescribed.  Reminded to bring in blood pressure log for follow  up appointment.  RECOMMENDATIONS: DASH/Mediterranean Diets are healthier choices for HTN.    Chronic pain of both knees -     meloxicam (MOBIC) 7.5 MG tablet; Take 1 tablet (7.5 mg total) by mouth daily. Work on losing weight to help reduce joint pain. May alternate with heat and ice application for pain relief. May also alternate with acetaminophen  as prescribed pain relief. Other alternatives include massage, acupuncture and water aerobics.     Colon cancer screening -     Fecal occult blood, imunochemical([facility])  Mixed hyperglyceridemia -     Lipid panel INSTRUCTIONS: Work on a low fat, heart healthy diet and participate in regular aerobic exercise program by working out at least 150 minutes per week; 5 days a week-30 minutes per day. Avoid red meat/beef/steak,  fried foods. junk foods, sodas, sugary drinks, unhealthy snacking, alcohol and smoking.  Drink at least 80 oz of water per day and monitor your carbohydrate intake daily.    Poor dentition -     Ambulatory referral to Dentistry  Corn of toe -     Ambulatory referral to Podiatry  Screening for deficiency anemia -     CBC with Differential  Atopic dermatitis, unspecified type -     triamcinolone ointment (KENALOG) 0.1 %; Apply 1 Application topically 2 (two) times daily.    Patient has been counseled on age-appropriate routine health concerns for screening and prevention. These are reviewed and up-to-date. Referrals have been placed accordingly. Immunizations are up-to-date or declined.    Subjective:   Chief Complaint  Patient presents with   Hypertension   HPI Danny Schneider 52 y.o. male presents to office today for HTN I have not seen him in almost 3 years.   VRI was used to communicate directly with patient for the entire encounter including providing detailed patient instructions.     HTN He is no longer taking amlodipine 5mg  daily. Will resume today.  BP Readings from Last 3 Encounters:  04/26/22 (!) 145/90  11/11/19 (!) 147/92  10/17/19 (!) 138/84    Knee Pain Chronic bilateral knee pain. He injured his right knee over 30 years ago (hit against a large metal container) and had arthroscopic surgery. Has been experiencing pain in his knees over the past few months.   Notes right sided pain which is only present when he twists and bends. He does carry heavy objects at work.   She has a corn on the right 5th toe that she would like excised.     Review of Systems  Constitutional:  Negative for fever, malaise/fatigue and weight loss.  HENT: Negative.  Negative for nosebleeds.   Eyes: Negative.  Negative for blurred vision, double vision and photophobia.  Respiratory: Negative.  Negative for cough and shortness of breath.   Cardiovascular: Negative.  Negative for chest pain, palpitations and leg swelling.  Gastrointestinal: Negative.  Negative for heartburn, nausea and vomiting.  Musculoskeletal:  Positive for joint pain. Negative for myalgias.  Skin:        eczema  Neurological: Negative.  Negative for dizziness, focal weakness, seizures and headaches.  Psychiatric/Behavioral: Negative.  Negative for suicidal ideas.     Past Medical History:  Diagnosis Date   Hypertriglyceridemia     Past Surgical History:  Procedure Laterality Date   CARPAL TUNNEL RELEASE Right 07/25/2019   Procedure: RIGHT CARPAL TUNNEL RELEASE;  Surgeon: Mcarthur Rossetti, MD;  Location: [facility];  Service: Orthopedics;  Laterality: Right;  Bier block   CARPAL TUNNEL RELEASE Left 10/17/2019   Procedure: LEFT CARPAL TUNNEL RELEASE;  Surgeon:  Mcarthur Rossetti, MD;  Location: [facility];  Service: Orthopedics;  Laterality: Left;   Right knee surgery      Family History  Problem Relation Age of Onset   Diabetes Paternal Uncle     Social History Reviewed with no changes to be made today.   Outpatient Medications Prior to Visit  Medication Sig Dispense Refill   amLODipine (NORVASC) 5 MG tablet TAKE 1 TABLET (5 MG TOTAL) BY MOUTH DAILY. 90 tablet 3   ibuprofen (ADVIL) 600 MG tablet TAKE 1 TABLET (600 MG TOTAL) BY MOUTH EVERY 8 (EIGHT) HOURS AS NEEDED. 60 tablet 1   No facility-administered medications prior to visit.    No Known Allergies     Objective:    BP (!) 145/90   Pulse 94   Ht 5\' 3"  (1.6 m)   Wt 155 lb 9.6 oz (70.6 kg)   SpO2 96%   BMI 27.56 kg/m  Wt Readings from Last 3 Encounters:  04/26/22 155 lb 9.6 oz (70.6 kg)  11/11/19 163 lb 6.4 oz (74.1 kg)  10/17/19 161 lb 6 oz (73.2 kg)    Physical Exam Vitals and nursing note reviewed.  Constitutional:      Appearance: He is well-developed.  HENT:     Head: Normocephalic and atraumatic.  Cardiovascular:     Rate and Rhythm: Normal rate and regular rhythm.     Heart sounds: Normal heart sounds. No murmur heard.    No friction rub. No gallop.  Pulmonary:     Effort: Pulmonary effort is normal. No tachypnea or respiratory distress.     Breath sounds: Normal breath sounds. No decreased breath sounds, wheezing, rhonchi or rales.  Chest:     Chest wall: No tenderness.  Abdominal:     General: Bowel sounds are normal.     Palpations: Abdomen is soft.  Musculoskeletal:        General: Normal range of motion.     Cervical back: Normal range of motion.  Feet:     Right foot:     Skin integrity: Callus and dry skin present.     Left foot:     Skin integrity: Dry skin present.  Skin:    General: Skin is warm and dry.  Neurological:     Mental Status: He is alert and oriented to person, place, and time.     Coordination:  Coordination normal.  Psychiatric:        Behavior: Behavior normal. Behavior is cooperative.        Thought Content: Thought content normal.        Judgment: Judgment normal.          Patient has been counseled extensively about nutrition and exercise as well as the importance of adherence with medications and regular follow-up. The patient was given clear instructions to go to ER or return to medical center if symptoms don't improve, worsen or new problems develop. The patient verbalized understanding.   Follow-up: Return in about 3 months (around 07/25/2022).  [name], FNP-BC [facility] [place], [place]   04/28/2022, 12:28 PM

## 2022-04-27 LAB — CBC WITH DIFFERENTIAL/PLATELET
Basophils Absolute: 0.1 10*3/uL (ref 0.0–0.2)
Basos: 1 %
EOS (ABSOLUTE): 0.3 10*3/uL (ref 0.0–0.4)
Eos: 4 %
Hematocrit: 45.1 % (ref 37.5–51.0)
Hemoglobin: 15.8 g/dL (ref 13.0–17.7)
Immature Grans (Abs): 0 10*3/uL (ref 0.0–0.1)
Immature Granulocytes: 0 %
Lymphocytes Absolute: 1.6 10*3/uL (ref 0.7–3.1)
Lymphs: 28 %
MCH: 31.6 pg (ref 26.6–33.0)
MCHC: 35 g/dL (ref 31.5–35.7)
MCV: 90 fL (ref 79–97)
Monocytes Absolute: 0.6 10*3/uL (ref 0.1–0.9)
Monocytes: 10 %
Neutrophils Absolute: 3.4 10*3/uL (ref 1.4–7.0)
Neutrophils: 57 %
Platelets: 166 10*3/uL (ref 150–450)
RBC: 5 x10E6/uL (ref 4.14–5.80)
RDW: 12.8 % (ref 11.6–15.4)
WBC: 5.9 10*3/uL (ref 3.4–10.8)

## 2022-04-27 LAB — CMP14+EGFR
ALT: 27 IU/L (ref 0–44)
AST: 21 IU/L (ref 0–40)
Albumin/Globulin Ratio: 2 (ref 1.2–2.2)
Albumin: 4.8 g/dL (ref 3.8–4.9)
Alkaline Phosphatase: 92 IU/L (ref 44–121)
BUN/Creatinine Ratio: 23 — ABNORMAL HIGH (ref 9–20)
BUN: 18 mg/dL (ref 6–24)
Bilirubin Total: 0.3 mg/dL (ref 0.0–1.2)
CO2: 24 mmol/L (ref 20–29)
Calcium: 9.7 mg/dL (ref 8.7–10.2)
Chloride: 103 mmol/L (ref 96–106)
Creatinine, Ser: 0.78 mg/dL (ref 0.76–1.27)
Globulin, Total: 2.4 g/dL (ref 1.5–4.5)
Glucose: 117 mg/dL — ABNORMAL HIGH (ref 70–99)
Potassium: 3.9 mmol/L (ref 3.5–5.2)
Sodium: 140 mmol/L (ref 134–144)
Total Protein: 7.2 g/dL (ref 6.0–8.5)
eGFR: 107 mL/min/{1.73_m2} (ref >59)

## 2022-04-27 LAB — LIPID PANEL
Chol/HDL Ratio: 4.5 ratio (ref 0.0–5.0)
Cholesterol, Total: 202 mg/dL — ABNORMAL HIGH (ref 100–199)
HDL: 45 mg/dL (ref >39)
LDL Chol Calc (NIH): 118 mg/dL — ABNORMAL HIGH (ref 0–99)
Triglycerides: 223 mg/dL — ABNORMAL HIGH (ref 0–149)
VLDL Cholesterol Cal: 39 mg/dL (ref 5–40)

## 2022-04-28 ENCOUNTER — Encounter: Payer: Self-pay | Admitting: Nurse Practitioner

## 2022-07-04 ENCOUNTER — Other Ambulatory Visit: Payer: Self-pay

## 2022-07-19 ENCOUNTER — Other Ambulatory Visit: Payer: Self-pay

## 2022-10-25 ENCOUNTER — Encounter: Payer: Self-pay | Admitting: Nurse Practitioner

## 2022-10-25 ENCOUNTER — Ambulatory Visit: Payer: Self-pay | Attending: Nurse Practitioner | Admitting: Nurse Practitioner

## 2022-10-25 ENCOUNTER — Other Ambulatory Visit: Payer: Self-pay

## 2022-10-25 VITALS — BP 166/77 | HR 63 | Ht 63.0 in | Wt 154.8 lb

## 2022-10-25 DIAGNOSIS — E783 Hyperchylomicronemia: Secondary | ICD-10-CM

## 2022-10-25 DIAGNOSIS — M25562 Pain in left knee: Secondary | ICD-10-CM

## 2022-10-25 DIAGNOSIS — G8929 Other chronic pain: Secondary | ICD-10-CM

## 2022-10-25 DIAGNOSIS — I10 Essential (primary) hypertension: Secondary | ICD-10-CM

## 2022-10-25 DIAGNOSIS — M25561 Pain in right knee: Secondary | ICD-10-CM

## 2022-10-25 MED ORDER — MELOXICAM 7.5 MG PO TABS
7.5000 mg | ORAL_TABLET | Freq: Every day | ORAL | 0 refills | Status: DC
  Filled 2022-10-25: qty 30, 30d supply, fill #0

## 2022-10-25 MED ORDER — LOSARTAN POTASSIUM 25 MG PO TABS
25.0000 mg | ORAL_TABLET | Freq: Every day | ORAL | 1 refills | Status: DC
  Filled 2022-10-25: qty 90, 90d supply, fill #0
  Filled 2023-01-24: qty 90, 90d supply, fill #1

## 2022-10-25 NOTE — Progress Notes (Signed)
Danny Schneider was seen today for medical management of chronic issues.  Diagnoses and all orders for this visit:  Primary hypertension Stop amlodipine. Start losartan  -     CMP14+EGFR -     losartan (COZAAR) 25 MG tablet; Take 1 tablet (25 mg total) by mouth daily.  Mixed hyperglyceridemia -     Lipid panel  Chronic pain of both knees -     meloxicam (MOBIC) 7.5 MG tablet; Take 1 tablet (7.5 mg total) by mouth daily. Take as needed for joint pain. Continue daily exercise.       Subjective:   Chief Complaint  Patient presents with   Medical Management of Chronic Issues   HPI Danny Schneider 52 y.o. male presents to office today for follow up to HTN  VRI was used to communicate directly with patient for the entire encounter including providing detailed patient instructions.    He has a past medical history of HTN, carpal tunnel release,  Hypertriglyceridemia.   HTN He states the blood pressure medication is causing dizziness, nausea and vomiting. This does not occur every time he takes amlodipine but a few times per week.  BP Readings from Last 3 Encounters:  10/25/22 (!) 166/77  04/26/22 (!) 145/90  11/11/19 (!) 147/92     He has intermittent generalized joint pain but does not take any OTC analgesics for this. His weight is down.   Review of Systems  Constitutional:  Negative for fever, malaise/fatigue and weight loss.  HENT: Negative.  Negative for nosebleeds.   Eyes: Negative.  Negative for blurred vision, double vision and photophobia.  Respiratory: Negative.  Negative for cough and shortness of breath.   Cardiovascular: Negative.  Negative for chest pain, palpitations and leg swelling.  Gastrointestinal: Negative.  Negative for heartburn, nausea and vomiting.  Musculoskeletal: Negative.  Negative for myalgias.  Neurological: Negative.  Negative for dizziness, focal weakness, seizures and headaches.  Psychiatric/Behavioral: Negative.  Negative for suicidal  ideas.     Past Medical History:  Diagnosis Date   Hypertriglyceridemia     Past Surgical History:  Procedure Laterality Date   CARPAL TUNNEL RELEASE Right 07/25/2019   Procedure: RIGHT CARPAL TUNNEL RELEASE;  Surgeon: Kathryne Hitch, MD;  Location: [facility];  Service: Orthopedics;  Laterality: Right;  Bier block   CARPAL TUNNEL RELEASE Left 10/17/2019   Procedure: LEFT CARPAL TUNNEL RELEASE;  Surgeon: Kathryne Hitch, MD;  Location: [facility];  Service: Orthopedics;  Laterality: Left;   Right knee surgery      Family History  Problem Relation Age of Onset   Diabetes Paternal Uncle     Social History Reviewed with no changes to be made today.   Outpatient Medications Prior to Visit  Medication Sig Dispense Refill   amLODipine (NORVASC) 5 MG tablet TAKE 1 TABLET (5 MG TOTAL) BY MOUTH DAILY. 90 tablet 3   meloxicam (MOBIC) 7.5 MG tablet Take 1 tablet (7.5 mg total) by mouth daily. (Patient not taking: Reported on 10/25/2022) 30 tablet 0   triamcinolone ointment (KENALOG) 0.1 % Apply 1 Application topically 2 (two) times daily. (Patient not taking: Reported on 10/25/2022) 30 g 0   No facility-administered medications prior to visit.    No Known Allergies     Objective:    BP (!) 166/77 (BP Location: Left Arm, Patient Position: Sitting, Cuff Size: Normal)   Pulse 63   Ht 5\' 3"  (1.6 m)   Wt 154 lb 12.8 oz (  70.2 kg)   SpO2 99%   BMI 27.42 kg/m  Wt Readings from Last 3 Encounters:  10/25/22 154 lb 12.8 oz (70.2 kg)  04/26/22 155 lb 9.6 oz (70.6 kg)  11/11/19 163 lb 6.4 oz (74.1 kg)    Physical Exam Vitals and nursing note reviewed.  Constitutional:      Appearance: He is well-developed.  HENT:     Head: Normocephalic and atraumatic.  Cardiovascular:     Rate and Rhythm: Normal rate and regular rhythm.     Heart sounds: Normal heart sounds. No murmur heard.    No friction rub. No gallop.  Pulmonary:     Effort: Pulmonary  effort is normal. No tachypnea or respiratory distress.     Breath sounds: Normal breath sounds. No decreased breath sounds, wheezing, rhonchi or rales.  Chest:     Chest wall: No tenderness.  Abdominal:     General: Bowel sounds are normal.     Palpations: Abdomen is soft.  Musculoskeletal:        General: Normal range of motion.     Cervical back: Normal range of motion.  Skin:    General: Skin is warm and dry.  Neurological:     Mental Status: He is alert and oriented to person, place, and time.     Coordination: Coordination normal.  Psychiatric:        Behavior: Behavior normal. Behavior is cooperative.        Thought Content: Thought content normal.        Judgment: Judgment normal.          Patient has been counseled extensively about nutrition and exercise as well as the importance of adherence with medications and regular follow-up. The patient was given clear instructions to go to ER or return to medical center if symptoms don't improve, worsen or new problems develop. The patient verbalized understanding.   Follow-up: Return in about 4 weeks (around 11/22/2022) for BP recheck with me luke or angela.   Claiborne Rigg, FNP-BC [facility] [place], [place] [phone]   10/25/2022, 2:12 PM

## 2022-10-26 ENCOUNTER — Other Ambulatory Visit: Payer: Self-pay | Admitting: Nurse Practitioner

## 2022-10-26 DIAGNOSIS — E781 Pure hyperglyceridemia: Secondary | ICD-10-CM

## 2022-10-26 MED ORDER — ATORVASTATIN CALCIUM 20 MG PO TABS
20.0000 mg | ORAL_TABLET | Freq: Every day | ORAL | 3 refills | Status: DC
  Filled 2022-10-26: qty 90, 90d supply, fill #0
  Filled 2023-01-24 (×2): qty 90, 90d supply, fill #1
  Filled 2023-05-02: qty 90, 90d supply, fill #2
  Filled 2023-08-01: qty 90, 90d supply, fill #3

## 2022-10-27 ENCOUNTER — Other Ambulatory Visit: Payer: Self-pay

## 2022-11-22 ENCOUNTER — Ambulatory Visit: Payer: Self-pay | Attending: Nurse Practitioner | Admitting: Nurse Practitioner

## 2022-11-22 ENCOUNTER — Encounter: Payer: Self-pay | Admitting: Nurse Practitioner

## 2022-11-22 ENCOUNTER — Other Ambulatory Visit: Payer: Self-pay

## 2022-11-22 VITALS — BP 126/73 | HR 60 | Ht 63.0 in | Wt 158.6 lb

## 2022-11-22 DIAGNOSIS — G8929 Other chronic pain: Secondary | ICD-10-CM

## 2022-11-22 DIAGNOSIS — I10 Essential (primary) hypertension: Secondary | ICD-10-CM

## 2022-11-22 DIAGNOSIS — M25551 Pain in right hip: Secondary | ICD-10-CM

## 2022-11-22 DIAGNOSIS — L209 Atopic dermatitis, unspecified: Secondary | ICD-10-CM

## 2022-11-22 MED ORDER — TRIAMCINOLONE ACETONIDE 0.1 % EX OINT
1.0000 | TOPICAL_OINTMENT | Freq: Two times a day (BID) | CUTANEOUS | 2 refills | Status: DC
  Filled 2022-11-22: qty 60, 30d supply, fill #0
  Filled 2023-01-24 (×2): qty 60, 30d supply, fill #1

## 2022-11-22 MED ORDER — METHOCARBAMOL 500 MG PO TABS
500.0000 mg | ORAL_TABLET | Freq: Four times a day (QID) | ORAL | 1 refills | Status: DC
  Filled 2022-11-22: qty 60, 15d supply, fill #0
  Filled 2022-12-12: qty 60, 15d supply, fill #1

## 2022-11-22 NOTE — Progress Notes (Signed)
Assessment & Plan:  Danny Schneider was seen today for blood pressure check.  Diagnoses and all orders for this visit:  Primary hypertension Continue losartan as prescribed.  Reminded to bring in blood pressure log for follow  up appointment.  RECOMMENDATIONS: DASH/Mediterranean Diets are healthier choices for HTN.    Chronic right hip pain -     DG Hip Unilat W OR W/O Pelvis Min 4 Views Right; Future May alternate with heat and ice application for pain relief. May also alternate with acetaminophen and Ibuprofen as prescribed pain relief. Other alternatives include massage, acupuncture and water aerobics.    Patient has been counseled on age-appropriate routine health concerns for screening and prevention. These are reviewed and up-to-date. Referrals have been placed accordingly. Immunizations are up-to-date or declined.    Subjective:   Chief Complaint  Patient presents with   Blood Pressure Check   HPI Danny Schneider 52 y.o. male presents to office today for follow up to BP  VRI was used to communicate directly with patient for the entire encounter including providing detailed patient instructions.    HTN Blood pressure is well controlled. He is taking losartan 25 mg daily as prescribed.  BP Readings from Last 3 Encounters:  11/22/22 126/73  10/25/22 (!) 166/77  04/26/22 (!) 145/90     He is experiencing chronic anterior right hip pain. Pain is aggravated by twisting and bends. He does carry heavy objects at work. Today he reports pain has not improved with taking meloxicam.    Review of Systems  Constitutional:  Negative for fever, malaise/fatigue and weight loss.  HENT: Negative.  Negative for nosebleeds.   Eyes: Negative.  Negative for blurred vision, double vision and photophobia.  Respiratory: Negative.  Negative for cough and shortness of breath.   Cardiovascular: Negative.  Negative for chest pain, palpitations and leg swelling.  Gastrointestinal: Negative.   Negative for heartburn, nausea and vomiting.  Musculoskeletal:  Positive for joint pain. Negative for myalgias.  Neurological: Negative.  Negative for dizziness, focal weakness, seizures and headaches.  Psychiatric/Behavioral: Negative.  Negative for suicidal ideas.     Past Medical History:  Diagnosis Date   Hypertriglyceridemia     Past Surgical History:  Procedure Laterality Date   CARPAL TUNNEL RELEASE Right 07/25/2019   Procedure: RIGHT CARPAL TUNNEL RELEASE;  Surgeon: Kathryne Hitch, MD;  Location: [facility];  Service: Orthopedics;  Laterality: Right;  Bier block   CARPAL TUNNEL RELEASE Left 10/17/2019   Procedure: LEFT CARPAL TUNNEL RELEASE;  Surgeon: Kathryne Hitch, MD;  Location: [facility];  Service: Orthopedics;  Laterality: Left;   Right knee surgery      Family History  Problem Relation Age of Onset   Diabetes Paternal Uncle     Social History Reviewed with no changes to be made today.   Outpatient Medications Prior to Visit  Medication Sig Dispense Refill   atorvastatin (LIPITOR) 20 MG tablet Take 1 tablet (20 mg total) by mouth daily. 90 tablet 3   losartan (COZAAR) 25 MG tablet Take 1 tablet (25 mg total) by mouth daily. 90 tablet 1   meloxicam (MOBIC) 7.5 MG tablet Take 1 tablet (7.5 mg total) by mouth daily. Take as needed for joint pain. 30 tablet 0   triamcinolone ointment (KENALOG) 0.1 % Apply 1 Application topically 2 (two) times daily. (Patient not taking: Reported on 10/25/2022) 30 g 0   No facility-administered medications prior to visit.    No Known  Allergies     Objective:    BP 126/73 (BP Location: Left Arm, Patient Position: Sitting, Cuff Size: Normal)   Pulse 60   Ht 5\' 3"  (1.6 m)   Wt 158 lb 9.6 oz (71.9 kg)   SpO2 98%   BMI 28.09 kg/m  Wt Readings from Last 3 Encounters:  11/22/22 158 lb 9.6 oz (71.9 kg)  10/25/22 154 lb 12.8 oz (70.2 kg)  04/26/22 155 lb 9.6 oz (70.6 kg)    Physical  Exam Vitals and nursing note reviewed.  Constitutional:      Appearance: He is well-developed.  HENT:     Head: Normocephalic and atraumatic.  Cardiovascular:     Rate and Rhythm: Normal rate and regular rhythm.     Heart sounds: Normal heart sounds. No murmur heard.    No friction rub. No gallop.  Pulmonary:     Effort: Pulmonary effort is normal. No tachypnea or respiratory distress.     Breath sounds: Normal breath sounds. No decreased breath sounds, wheezing, rhonchi or rales.  Chest:     Chest wall: No tenderness.  Abdominal:     General: Bowel sounds are normal.     Palpations: Abdomen is soft.  Musculoskeletal:        General: Normal range of motion.     Cervical back: Normal range of motion.     Right hip: Tenderness present.  Skin:    General: Skin is warm and dry.  Neurological:     Mental Status: He is alert and oriented to person, place, and time.     Coordination: Coordination normal.  Psychiatric:        Behavior: Behavior normal. Behavior is cooperative.        Thought Content: Thought content normal.        Judgment: Judgment normal.          Patient has been counseled extensively about nutrition and exercise as well as the importance of adherence with medications and regular follow-up. The patient was given clear instructions to go to ER or return to medical center if symptoms don't improve, worsen or new problems develop. The patient verbalized understanding.   Follow-up: Return in about 3 months (around 02/21/2023).   Claiborne Rigg, FNP-BC [facility] [place], [place] [phone]   11/22/2022, 2:57 PM

## 2022-12-12 ENCOUNTER — Other Ambulatory Visit: Payer: Self-pay

## 2023-01-24 ENCOUNTER — Other Ambulatory Visit: Payer: Self-pay

## 2023-01-27 ENCOUNTER — Other Ambulatory Visit: Payer: Self-pay

## 2023-02-21 ENCOUNTER — Ambulatory Visit: Payer: Self-pay | Admitting: Nurse Practitioner

## 2023-05-02 ENCOUNTER — Other Ambulatory Visit: Payer: Self-pay

## 2023-05-02 ENCOUNTER — Other Ambulatory Visit: Payer: Self-pay | Admitting: Nurse Practitioner

## 2023-05-02 DIAGNOSIS — I10 Essential (primary) hypertension: Secondary | ICD-10-CM

## 2023-05-02 MED ORDER — LOSARTAN POTASSIUM 25 MG PO TABS
25.0000 mg | ORAL_TABLET | Freq: Every day | ORAL | 0 refills | Status: DC
Start: 2023-05-02 — End: 2023-08-01
  Filled 2023-05-02: qty 90, 90d supply, fill #0

## 2023-05-02 NOTE — Telephone Encounter (Signed)
Requested by interface surescripts. Requested Prescriptions  Pending Prescriptions Disp Refills   losartan (COZAAR) 25 MG tablet 90 tablet 0    Sig: Take 1 tablet (25 mg total) by mouth daily.     Cardiovascular:  Angiotensin Receptor Blockers Failed - 05/02/2023  1:20 PM      Failed - Cr in normal range and within 180 days    Creatinine, Ser  Date Value Ref Range Status  10/25/2022 0.73 (L) 0.76 - 1.27 mg/dL Final         Failed - K in normal range and within 180 days    Potassium  Date Value Ref Range Status  10/25/2022 3.7 3.5 - 5.2 mmol/L Final         Passed - Patient is not pregnant      Passed - Last BP in normal range    BP Readings from Last 1 Encounters:  11/22/22 126/73         Passed - Valid encounter within last 6 months    Recent Outpatient Visits           5 months ago Primary hypertension   [facility] [place]. [facility] Claiborne Rigg, NP   6 months ago Primary hypertension   [facility] [place]. [facility] Claiborne Rigg, NP   1 year ago Essential hypertension   [facility] [place]. [facility] Claiborne Rigg, NP   3 years ago Atopic dermatitis, mild   [facility] [place]. [facility] Zonia Kief, [place] J, NP   3 years ago Essential hypertension   [facility] [place]. [facility] Claiborne Rigg, [place]

## 2023-08-01 ENCOUNTER — Other Ambulatory Visit: Payer: Self-pay | Admitting: Nurse Practitioner

## 2023-08-01 ENCOUNTER — Other Ambulatory Visit: Payer: Self-pay

## 2023-08-01 DIAGNOSIS — I10 Essential (primary) hypertension: Secondary | ICD-10-CM

## 2023-08-02 ENCOUNTER — Other Ambulatory Visit: Payer: Self-pay

## 2023-08-02 MED ORDER — LOSARTAN POTASSIUM 25 MG PO TABS
25.0000 mg | ORAL_TABLET | Freq: Every day | ORAL | 0 refills | Status: AC
Start: 2023-08-02 — End: ?
  Filled 2023-08-02: qty 30, 30d supply, fill #0

## 2023-08-04 ENCOUNTER — Other Ambulatory Visit: Payer: Self-pay

## 2023-09-04 ENCOUNTER — Other Ambulatory Visit: Payer: Self-pay | Admitting: Family Medicine

## 2023-09-04 ENCOUNTER — Other Ambulatory Visit: Payer: Self-pay

## 2023-09-04 DIAGNOSIS — I10 Essential (primary) hypertension: Secondary | ICD-10-CM

## 2023-09-05 ENCOUNTER — Other Ambulatory Visit: Payer: Self-pay

## 2023-09-05 MED ORDER — LOSARTAN POTASSIUM 25 MG PO TABS
25.0000 mg | ORAL_TABLET | Freq: Every day | ORAL | 0 refills | Status: DC
Start: 2023-09-05 — End: 2023-09-19
  Filled 2023-09-05: qty 30, 30d supply, fill #0

## 2023-09-12 ENCOUNTER — Other Ambulatory Visit: Payer: Self-pay

## 2023-09-19 ENCOUNTER — Encounter: Payer: Self-pay | Admitting: Nurse Practitioner

## 2023-09-19 ENCOUNTER — Ambulatory Visit: Payer: Self-pay | Attending: Nurse Practitioner | Admitting: Nurse Practitioner

## 2023-09-19 ENCOUNTER — Other Ambulatory Visit: Payer: Self-pay

## 2023-09-19 VITALS — BP 124/76 | HR 61 | Resp 19 | Ht 63.0 in | Wt 154.6 lb

## 2023-09-19 DIAGNOSIS — R109 Unspecified abdominal pain: Secondary | ICD-10-CM

## 2023-09-19 DIAGNOSIS — Z1211 Encounter for screening for malignant neoplasm of colon: Secondary | ICD-10-CM

## 2023-09-19 DIAGNOSIS — Z125 Encounter for screening for malignant neoplasm of prostate: Secondary | ICD-10-CM

## 2023-09-19 DIAGNOSIS — Z23 Encounter for immunization: Secondary | ICD-10-CM

## 2023-09-19 DIAGNOSIS — I10 Essential (primary) hypertension: Secondary | ICD-10-CM

## 2023-09-19 DIAGNOSIS — E781 Pure hyperglyceridemia: Secondary | ICD-10-CM

## 2023-09-19 MED ORDER — ATORVASTATIN CALCIUM 20 MG PO TABS
20.0000 mg | ORAL_TABLET | Freq: Every day | ORAL | 3 refills | Status: DC
  Filled 2023-09-19: qty 90, 90d supply, fill #0

## 2023-09-19 MED ORDER — LOSARTAN POTASSIUM 25 MG PO TABS
25.0000 mg | ORAL_TABLET | Freq: Every day | ORAL | 1 refills | Status: DC
  Filled 2023-09-19: qty 90, 90d supply, fill #0

## 2023-09-19 MED ORDER — ATORVASTATIN CALCIUM 20 MG PO TABS
20.0000 mg | ORAL_TABLET | Freq: Every day | ORAL | 3 refills | Status: AC
  Filled 2023-09-19 – 2023-11-27 (×2): qty 90, 90d supply, fill #0
  Filled 2024-02-27: qty 90, 90d supply, fill #1

## 2023-09-19 MED ORDER — LOSARTAN POTASSIUM 25 MG PO TABS
25.0000 mg | ORAL_TABLET | Freq: Every day | ORAL | 1 refills | Status: DC
  Filled 2023-09-19 – 2023-10-09 (×2): qty 90, 90d supply, fill #0
  Filled 2024-01-15: qty 90, 90d supply, fill #1

## 2023-09-19 NOTE — Progress Notes (Signed)
 Assessment & Plan:  Danny Schneider was seen today for hypertension.  Diagnoses and all orders for this visit:  Primary hypertension Blood pressure is well controlled -     CMP14+EGFR -     losartan  (COZAAR ) 25 MG tablet; Take 1 tablet (25 mg total) by mouth daily. Continue all antihypertensives as prescribed.  Reminded to bring in blood pressure log for follow  up appointment.  RECOMMENDATIONS: DASH/Mediterranean Diets are healthier choices for HTN.    Colon cancer screening -     Fecal occult blood, imunochemical([facility]/Sun[facility])  Prostate cancer screening -     PSA  Hypertriglyceridemia -     Lipid panel -     atorvastatin  (LIPITOR) 20 MG tablet; Take 1 tablet (20 mg total) by mouth daily. INSTRUCTIONS: Work on a low fat, heart healthy diet and participate in regular aerobic exercise program by working out at least 150 minutes per week; 5 days a week-30 minutes per day. Avoid red meat/beef/steak,  fried foods. junk foods, sodas, sugary drinks, unhealthy snacking, alcohol and smoking.  Drink at least 80 oz of water per day and monitor your carbohydrate intake daily.     Need for hepatitis vaccination -     Heplisav-B (HepB-CPG) Vaccine  Need for shingles vaccine -     Zoster, Recombinant (Shingrix)  Right sided abdominal pain -     Urinalysis, Complete    Patient has been counseled on age-appropriate routine health concerns for screening and prevention. These are reviewed and up-to-date. Referrals have been placed accordingly. Immunizations are up-to-date or declined.    Subjective:   Chief Complaint  Patient presents with   Hypertension    Danny Schneider 52 y.o. male presents to office today for HTN  He has a past medical history of HTN and Hypertriglyceridemia.  VRI was used to communicate directly with patient for the entire encounter including providing detailed patient instructions.      HTN Currently taking losartan  25 mg daily.  Blood pressure is  well-controlled BP Readings from Last 3 Encounters:  09/19/23 124/76  11/22/22 126/73  10/25/22 (!) 166/77     He is experiencing chronic anterior right hip and side pain. Pain is aggravated by twisting and bends. He does carry heavy objects at work. Today he reports pain has not improved with taking meloxicam  or methocarbamol . He denies N/V, diarrhea or constipation. He could not afford to have his xray done due to financial limitations. He was not approved for financial assistance program.   Review of Systems  Constitutional:  Negative for fever, malaise/fatigue and weight loss.  HENT: Negative.  Negative for nosebleeds.   Eyes: Negative.  Negative for blurred vision, double vision and photophobia.  Respiratory: Negative.  Negative for cough and shortness of breath.   Cardiovascular: Negative.  Negative for chest pain, palpitations and leg swelling.  Gastrointestinal:  Positive for abdominal pain. Negative for blood in stool, constipation, diarrhea, heartburn, melena, nausea and vomiting.  Genitourinary:  Negative for dysuria and hematuria.  Musculoskeletal:  Positive for joint pain. Negative for myalgias.  Neurological: Negative.  Negative for dizziness, focal weakness, seizures and headaches.  Psychiatric/Behavioral: Negative.  Negative for suicidal ideas.     Past Medical History:  Diagnosis Date   Hypertriglyceridemia     Past Surgical History:  Procedure Laterality Date   CARPAL TUNNEL RELEASE Right 07/25/2019   Procedure: RIGHT CARPAL TUNNEL RELEASE;  Surgeon: Vernetta Lonni GRADE, MD;  Location: [facility];  Service: Orthopedics;  Laterality: Right;  Bier block   CARPAL TUNNEL RELEASE Left 10/17/2019   Procedure: LEFT CARPAL TUNNEL RELEASE;  Surgeon: Vernetta Lonni GRADE, MD;  Location: [facility];  Service: Orthopedics;  Laterality: Left;   Right knee surgery      Family History  Problem Relation Age of Onset   Diabetes Paternal Uncle      Social History Reviewed with no changes to be made today.   Outpatient Medications Prior to Visit  Medication Sig Dispense Refill   atorvastatin  (LIPITOR) 20 MG tablet Take 1 tablet (20 mg total) by mouth daily. 90 tablet 3   losartan  (COZAAR ) 25 MG tablet Take 1 tablet (25 mg total) by mouth daily. Please schedule PCP appointment for additional refills. 30 tablet 0   meloxicam  (MOBIC ) 7.5 MG tablet Take 1 tablet (7.5 mg total) by mouth daily. Take as needed for joint pain. (Patient not taking: Reported on 09/19/2023) 30 tablet 0   methocarbamol  (ROBAXIN ) 500 MG tablet Take 1 tablet (500 mg total) by mouth 4 (four) times daily. For right sided pain (Patient not taking: Reported on 09/19/2023) 60 tablet 1   triamcinolone  ointment (KENALOG ) 0.1 % Apply 1 Application topically 2 (two) times daily. (Patient not taking: Reported on 09/19/2023) 60 g 2   No facility-administered medications prior to visit.    No Known Allergies     Objective:    BP 124/76 (BP Location: Left Arm, Patient Position: Sitting, Cuff Size: Normal)   Pulse 61   Resp 19   Ht 5' 3 (1.6 m)   Wt 154 lb 9.6 oz (70.1 kg)   SpO2 100%   BMI 27.39 kg/m  Wt Readings from Last 3 Encounters:  09/19/23 154 lb 9.6 oz (70.1 kg)  11/22/22 158 lb 9.6 oz (71.9 kg)  10/25/22 154 lb 12.8 oz (70.2 kg)    Physical Exam Vitals and nursing note reviewed.  Constitutional:      Appearance: He is well-developed.  HENT:     Head: Normocephalic and atraumatic.   Cardiovascular:     Rate and Rhythm: Normal rate and regular rhythm.     Heart sounds: Normal heart sounds. No murmur heard.    No friction rub. No gallop.  Pulmonary:     Effort: Pulmonary effort is normal. No tachypnea or respiratory distress.     Breath sounds: Normal breath sounds. No decreased breath sounds, wheezing, rhonchi or rales.  Chest:     Chest wall: No tenderness.  Abdominal:     General: Bowel sounds are normal.     Palpations: Abdomen is soft.    Musculoskeletal:        General: Normal range of motion.     Cervical back: Normal range of motion.   Skin:    General: Skin is warm and dry.   Neurological:     Mental Status: He is alert and oriented to person, place, and time.     Coordination: Coordination normal.   Psychiatric:        Behavior: Behavior normal. Behavior is cooperative.        Thought Content: Thought content normal.        Judgment: Judgment normal.          Patient has been counseled extensively about nutrition and exercise as well as the importance of adherence with medications and regular follow-up. The patient was given clear instructions to go to ER or return to medical center if symptoms don't improve, worsen or new problems develop. The patient  verbalized understanding.   Follow-up: Return in about 6 months (around 03/21/2024).   Haze LELON Servant, FNP-BC [facility] [place], [place] [phone]   09/19/2023, 11:02 AM

## 2023-09-19 NOTE — Patient Instructions (Signed)
 [facility] Radiology Department [address] [place], [place] [phone]

## 2023-09-20 LAB — CMP14+EGFR
ALT: 28 IU/L (ref 0–44)
AST: 27 IU/L (ref 0–40)
Albumin: 4.4 g/dL (ref 3.8–4.9)
Alkaline Phosphatase: 81 IU/L (ref 44–121)
BUN/Creatinine Ratio: 21 — ABNORMAL HIGH (ref 9–20)
BUN: 15 mg/dL (ref 6–24)
Bilirubin Total: 0.6 mg/dL (ref 0.0–1.2)
CO2: 20 mmol/L (ref 20–29)
Calcium: 8.9 mg/dL (ref 8.7–10.2)
Chloride: 107 mmol/L — ABNORMAL HIGH (ref 96–106)
Creatinine, Ser: 0.72 mg/dL — ABNORMAL LOW (ref 0.76–1.27)
Globulin, Total: 2.2 g/dL (ref 1.5–4.5)
Glucose: 92 mg/dL (ref 70–99)
Potassium: 4 mmol/L (ref 3.5–5.2)
Sodium: 141 mmol/L (ref 134–144)
Total Protein: 6.6 g/dL (ref 6.0–8.5)
eGFR: 109 mL/min/{1.73_m2} (ref >59)

## 2023-09-20 LAB — MICROSCOPIC EXAMINATION
Bacteria, UA: NONE SEEN
WBC, UA: NONE SEEN /HPF (ref 0–5)

## 2023-09-20 LAB — URINALYSIS, COMPLETE
Bilirubin, UA: NEGATIVE
Glucose, UA: NEGATIVE
Ketones, UA: NEGATIVE
Leukocytes,UA: NEGATIVE
Nitrite, UA: NEGATIVE
RBC, UA: NEGATIVE
Specific Gravity, UA: 1.027 (ref 1.005–1.030)
Urobilinogen, Ur: 0.2 mg/dL (ref 0.2–1.0)
pH, UA: 5.5 (ref 5.0–7.5)

## 2023-09-20 LAB — LIPID PANEL
Chol/HDL Ratio: 2.8 ratio (ref 0.0–5.0)
Cholesterol, Total: 119 mg/dL (ref 100–199)
HDL: 43 mg/dL (ref >39)
LDL Chol Calc (NIH): 56 mg/dL (ref 0–99)
Triglycerides: 112 mg/dL (ref 0–149)
VLDL Cholesterol Cal: 20 mg/dL (ref 5–40)

## 2023-09-20 LAB — PSA: Prostate Specific Ag, Serum: 0.9 ng/mL (ref 0.0–4.0)

## 2023-09-21 ENCOUNTER — Ambulatory Visit: Payer: Self-pay | Admitting: Nurse Practitioner

## 2023-09-21 LAB — FECAL OCCULT BLOOD, IMMUNOCHEMICAL: Fecal Occult Bld: NEGATIVE

## 2023-10-09 ENCOUNTER — Other Ambulatory Visit: Payer: Self-pay

## 2023-10-11 ENCOUNTER — Other Ambulatory Visit: Payer: Self-pay

## 2023-11-27 ENCOUNTER — Other Ambulatory Visit: Payer: Self-pay

## 2023-11-28 ENCOUNTER — Other Ambulatory Visit: Payer: Self-pay

## 2024-01-15 ENCOUNTER — Other Ambulatory Visit: Payer: Self-pay

## 2024-01-16 ENCOUNTER — Other Ambulatory Visit: Payer: Self-pay

## 2024-02-27 ENCOUNTER — Other Ambulatory Visit: Payer: Self-pay

## 2024-03-26 ENCOUNTER — Encounter: Payer: Self-pay | Admitting: Nurse Practitioner

## 2024-03-26 ENCOUNTER — Other Ambulatory Visit: Payer: Self-pay

## 2024-03-26 ENCOUNTER — Ambulatory Visit: Payer: Self-pay | Attending: Nurse Practitioner | Admitting: Nurse Practitioner

## 2024-03-26 VITALS — BP 147/85 | HR 57 | Ht 63.0 in | Wt 167.6 lb

## 2024-03-26 DIAGNOSIS — E7800 Pure hypercholesterolemia, unspecified: Secondary | ICD-10-CM

## 2024-03-26 DIAGNOSIS — I10 Essential (primary) hypertension: Secondary | ICD-10-CM

## 2024-03-26 DIAGNOSIS — Z23 Encounter for immunization: Secondary | ICD-10-CM

## 2024-03-26 MED ORDER — LOSARTAN POTASSIUM 25 MG PO TABS
25.0000 mg | ORAL_TABLET | Freq: Every day | ORAL | 1 refills | Status: AC
  Filled 2024-03-26 – 2024-04-11 (×2): qty 90, 90d supply, fill #0

## 2024-03-26 NOTE — Progress Notes (Signed)
 "  Assessment & Plan:  Danny Schneider was seen today for hypertension.  Diagnoses and all orders for this visit:  Primary hypertension -     losartan  (COZAAR ) 25 MG tablet; Take 1 tablet (25 mg total) by mouth daily. -     CMP14+EGFR Discussed weight gain and how it affects blood pressure Focus on increased activity and weight loss prior to next visit   Need for influenza vaccination -     Flu vaccine trivalent PF, 6mos and older(Flulaval,Afluria,Fluarix,Fluzone)  Hypercholesterolemia -     Lipid panel INSTRUCTIONS: Work on a low fat, heart healthy diet and participate in regular aerobic exercise program by working out at least 150 minutes per week; 5 days a week-30 minutes per day. Avoid red meat/beef/steak,  fried foods. junk foods, sodas, sugary drinks, unhealthy snacking, alcohol and smoking.  Drink at least 80 oz of water per day and monitor your carbohydrate intake daily.      Patient has been counseled on age-appropriate routine health concerns for screening and prevention. These are reviewed and up-to-date. Referrals have been placed accordingly. Immunizations are up-to-date or declined.    Subjective:   Chief Complaint  Patient presents with   Hypertension    Danny Schneider 54 y.o. male presents to office today for follow up to HTN  VRI was used to communicate directly with patient for the entire encounter including providing detailed patient instructions.    HTN He is currently taking losartan  25 mg daily.  BP Readings from Last 3 Encounters:  03/26/24 (!) 152/89  09/19/23 124/76  11/22/22 126/73    HPL He is currently prescribed atorvastatin  20 mg daily The ASCVD Risk score (Arnett DK, et al., 2019) failed to calculate for the following reasons:   The valid total cholesterol range is 130 to 320 mg/dL  Lab Results  Component Value Date   CHOL 119 09/19/2023   CHOL 196 10/25/2022   CHOL 202 (H) 04/26/2022   Lab Results  Component Value Date   HDL 43  09/19/2023   HDL 46 10/25/2022   HDL 45 04/26/2022   Lab Results  Component Value Date   LDLCALC 56 09/19/2023   LDLCALC 87 10/25/2022   LDLCALC 118 (H) 04/26/2022   Lab Results  Component Value Date   TRIG 112 09/19/2023   TRIG 388 (H) 10/25/2022   TRIG 223 (H) 04/26/2022   Lab Results  Component Value Date   CHOLHDL 2.8 09/19/2023   CHOLHDL 4.3 10/25/2022   CHOLHDL 4.5 04/26/2022    Review of Systems  Constitutional:  Negative for fever, malaise/fatigue and weight loss.  HENT: Negative.  Negative for nosebleeds.   Eyes: Negative.  Negative for blurred vision, double vision and photophobia.  Respiratory: Negative.  Negative for cough and shortness of breath.   Cardiovascular: Negative.  Negative for chest pain, palpitations and leg swelling.  Gastrointestinal: Negative.  Negative for heartburn, nausea and vomiting.  Musculoskeletal: Negative.  Negative for myalgias.  Neurological: Negative.  Negative for dizziness, focal weakness, seizures and headaches.  Psychiatric/Behavioral: Negative.  Negative for suicidal ideas.     Past Medical History:  Diagnosis Date   Hypertriglyceridemia     Past Surgical History:  Procedure Laterality Date   CARPAL TUNNEL RELEASE Right 07/25/2019   Procedure: RIGHT CARPAL TUNNEL RELEASE;  Surgeon: Vernetta Lonni GRADE, MD;  Location: [facility];  Service: Orthopedics;  Laterality: Right;  Bier block   CARPAL TUNNEL RELEASE Left 10/17/2019   Procedure: LEFT CARPAL TUNNEL  RELEASE;  Surgeon: Vernetta Lonni GRADE, MD;  Location: [facility];  Service: Orthopedics;  Laterality: Left;   Right knee surgery      Family History  Problem Relation Age of Onset   Diabetes Paternal Uncle     Social History Reviewed with no changes to be made today.   Outpatient Medications Prior to Visit  Medication Sig Dispense Refill   atorvastatin  (LIPITOR) 20 MG tablet Take 1 tablet (20 mg total) by mouth daily. 90 tablet 3    losartan  (COZAAR ) 25 MG tablet Take 1 tablet (25 mg total) by mouth daily. 90 tablet 1   No facility-administered medications prior to visit.    Allergies[1]     Objective:    BP (!) 152/89 (BP Location: Left Arm, Patient Position: Sitting, Cuff Size: Normal)   Pulse (!) 57   Ht 5' 3 (1.6 m)   Wt 167 lb 9.6 oz (76 kg)   SpO2 97%   BMI 29.69 kg/m  Wt Readings from Last 3 Encounters:  03/26/24 167 lb 9.6 oz (76 kg)  09/19/23 154 lb 9.6 oz (70.1 kg)  11/22/22 158 lb 9.6 oz (71.9 kg)    Physical Exam Vitals and nursing note reviewed.  Constitutional:      Appearance: He is well-developed.  HENT:     Head: Normocephalic and atraumatic.  Cardiovascular:     Rate and Rhythm: Normal rate and regular rhythm.     Heart sounds: Normal heart sounds. No murmur heard.    No friction rub. No gallop.  Pulmonary:     Effort: Pulmonary effort is normal. No tachypnea or respiratory distress.     Breath sounds: Normal breath sounds. No decreased breath sounds, wheezing, rhonchi or rales.  Chest:     Chest wall: No tenderness.  Abdominal:     General: Bowel sounds are normal.     Palpations: Abdomen is soft.  Musculoskeletal:        General: Normal range of motion.     Cervical back: Normal range of motion.  Skin:    General: Skin is warm and dry.  Neurological:     Mental Status: He is alert and oriented to person, place, and time.     Coordination: Coordination normal.  Psychiatric:        Behavior: Behavior normal. Behavior is cooperative.        Thought Content: Thought content normal.        Judgment: Judgment normal.          Patient has been counseled extensively about nutrition and exercise as well as the importance of adherence with medications and regular follow-up. The patient was given clear instructions to go to ER or return to medical center if symptoms don't improve, worsen or new problems develop. The patient verbalized understanding.   Follow-up: Return in  about 6 months (around 09/23/2024).   Haze LELON Servant, FNP-BC [facility] [place], [place] [phone]   03/26/2024, 10:20 AM    [1] No Known Allergies  "

## 2024-03-26 NOTE — Patient Instructions (Addendum)
 Danny Schneider

## 2024-03-27 ENCOUNTER — Ambulatory Visit: Payer: Self-pay | Admitting: Nurse Practitioner

## 2024-03-27 LAB — LIPID PANEL
Chol/HDL Ratio: 3 ratio (ref 0.0–5.0)
Cholesterol, Total: 136 mg/dL (ref 100–199)
HDL: 45 mg/dL
LDL Chol Calc (NIH): 68 mg/dL (ref 0–99)
Triglycerides: 129 mg/dL (ref 0–149)
VLDL Cholesterol Cal: 23 mg/dL (ref 5–40)

## 2024-03-27 LAB — CMP14+EGFR
ALT: 35 IU/L (ref 0–44)
AST: 24 IU/L (ref 0–40)
Albumin: 4.7 g/dL (ref 3.8–4.9)
Alkaline Phosphatase: 84 IU/L (ref 47–123)
BUN/Creatinine Ratio: 21 — ABNORMAL HIGH (ref 9–20)
BUN: 15 mg/dL (ref 6–24)
Bilirubin Total: 0.5 mg/dL (ref 0.0–1.2)
CO2: 23 mmol/L (ref 20–29)
Calcium: 9.4 mg/dL (ref 8.7–10.2)
Chloride: 101 mmol/L (ref 96–106)
Creatinine, Ser: 0.71 mg/dL — ABNORMAL LOW (ref 0.76–1.27)
Globulin, Total: 2.3 g/dL (ref 1.5–4.5)
Glucose: 107 mg/dL — ABNORMAL HIGH (ref 70–99)
Potassium: 4 mmol/L (ref 3.5–5.2)
Sodium: 139 mmol/L (ref 134–144)
Total Protein: 7 g/dL (ref 6.0–8.5)
eGFR: 109 mL/min/1.73

## 2024-04-10 NOTE — Telephone Encounter (Signed)
 Copied from CRM #[id number]. Topic: Clinical - Lab/Test Results >> Apr 10, 2024 12:55 PM Emylou G wrote: Reason for CRM: relayed results of labs

## 2024-04-11 ENCOUNTER — Other Ambulatory Visit: Payer: Self-pay

## 2024-09-23 ENCOUNTER — Ambulatory Visit: Payer: Self-pay | Admitting: Nurse Practitioner
